# Patient Record
Sex: Female | Born: 1955 | Race: White | Hispanic: No | Marital: Single | Smoking: Never smoker
Health system: Southern US, Community
[De-identification: ages and names within clinical notes are randomized; demographics above are authoritative.]

## PROBLEM LIST (undated history)

## (undated) DIAGNOSIS — E039 Hypothyroidism, unspecified: Secondary | ICD-10-CM

## (undated) DIAGNOSIS — M25472 Effusion, left ankle: Secondary | ICD-10-CM

## (undated) DIAGNOSIS — K2100 Gastro-esophageal reflux disease with esophagitis, without bleeding: Secondary | ICD-10-CM

## (undated) DIAGNOSIS — E079 Disorder of thyroid, unspecified: Secondary | ICD-10-CM

## (undated) DIAGNOSIS — K219 Gastro-esophageal reflux disease without esophagitis: Secondary | ICD-10-CM

## (undated) DIAGNOSIS — M810 Age-related osteoporosis without current pathological fracture: Secondary | ICD-10-CM

## (undated) DIAGNOSIS — D219 Benign neoplasm of connective and other soft tissue, unspecified: Secondary | ICD-10-CM

## (undated) DIAGNOSIS — Z780 Asymptomatic menopausal state: Secondary | ICD-10-CM

## (undated) DIAGNOSIS — E7800 Pure hypercholesterolemia, unspecified: Secondary | ICD-10-CM

## (undated) DIAGNOSIS — F32A Depression, unspecified: Secondary | ICD-10-CM

## (undated) DIAGNOSIS — F322 Major depressive disorder, single episode, severe without psychotic features: Secondary | ICD-10-CM

## (undated) DIAGNOSIS — I10 Essential (primary) hypertension: Secondary | ICD-10-CM

## (undated) DIAGNOSIS — K635 Polyp of colon: Secondary | ICD-10-CM

## (undated) HISTORY — PX: TONSILLECTOMY: SUR1361

## (undated) HISTORY — PX: COLONOSCOPY: SHX174

---

## 2017-12-12 ENCOUNTER — Ambulatory Visit
Admission: RE | Admit: 2017-12-12 | Discharge: 2017-12-12 | Disposition: A | Discharge status: Home / Self Care | Payer: Self-pay | Source: Ambulatory Visit | Attending: Oncology | Admitting: Oncology

## 2017-12-12 ENCOUNTER — Ambulatory Visit: Payer: Self-pay | Attending: Oncology

## 2017-12-12 ENCOUNTER — Other Ambulatory Visit: Payer: Self-pay

## 2017-12-12 VITALS — BP 102/70 | HR 61 | Temp 98.0°F | Resp 12 | Ht 65.0 in | Wt 167.0 lb

## 2017-12-12 DIAGNOSIS — Z0000 Encounter for general adult medical examination without abnormal findings: Secondary | ICD-10-CM | POA: Insufficient documentation

## 2017-12-12 NOTE — Progress Notes (Signed)
  Subjective:     Patient ID: Berta Denson, female   DOB: 1955/12/09, 62 y.o.   MRN: [id number]  HPI   Review of Systems     Objective:   Physical Exam  Pulmonary/Chest: Right breast exhibits no inverted nipple, no mass, no nipple discharge, no skin change and no tenderness. Left breast exhibits no inverted nipple, no mass, no nipple discharge, no skin change and no tenderness. Breasts are asymmetrical.  Right breast larger than left  Genitourinary: No labial fusion. There is no rash, tenderness, lesion or injury on the right labia. There is no rash, tenderness, lesion or injury on the left labia. Uterus is not deviated, not enlarged, not fixed and not tender. Cervix exhibits no motion tenderness, no discharge and no friability. Right adnexum displays no mass, no tenderness and no fullness. Left adnexum displays no mass, no tenderness and no fullness. No erythema, tenderness or bleeding in the vagina. No foreign body in the vagina. No signs of injury around the vagina. No vaginal discharge found.       Assessment:     62 year old patient presents for [facility] clinic visit.  Patient screened, and meets [facility] eligibility.  Patient does not have insurance, Medicare or Medicaid.  Handout given on Affordable Care Act.  Instructed patient on breast self awareness using teach back method.  Clinical breast exam unremarkable.  No mass or lump palpated.  Patient had a pap in April 2018 with LSIL/HPV positive results.  Patient states she had follow-up, and normal biopsy results.  Unable to find.  Pap performed.     Plan:     Sent for bilateral screening mammogram.  Specimen collected for pap.

## 2017-12-12 NOTE — Progress Notes (Signed)
Sent for bilateral screening mammogram. Specimen collected for pap.

## 2017-12-14 ENCOUNTER — Other Ambulatory Visit: Payer: Self-pay | Admitting: *Deleted

## 2017-12-14 DIAGNOSIS — N6489 Other specified disorders of breast: Secondary | ICD-10-CM

## 2017-12-19 LAB — PAP LB AND HPV HIGH-RISK
HPV, high-risk: POSITIVE — AB
PAP SMEAR COMMENT: 0

## 2017-12-21 NOTE — Progress Notes (Signed)
Phoned patient with ASCUS/HPV positive pap results.  Will schedule with GYN clinic, and give appointment to patient next week.

## 2017-12-24 ENCOUNTER — Ambulatory Visit
Admission: RE | Admit: 2017-12-24 | Discharge: 2017-12-24 | Disposition: A | Discharge status: Home / Self Care | Payer: Self-pay | Source: Ambulatory Visit | Attending: Oncology | Admitting: Oncology

## 2017-12-24 DIAGNOSIS — N6489 Other specified disorders of breast: Secondary | ICD-10-CM | POA: Insufficient documentation

## 2017-12-25 NOTE — Progress Notes (Signed)
Phoned patient with Birads 2 mammogram results.  Scheduled her with Dr. Logan Bores at [facility] on 01/09/18 at 2:00.  Left appointment info message, and mailed appointment reminder.

## 2017-12-25 NOTE — Patient Instructions (Signed)
Colposcopy  Colposcopy is a procedure to examine the lowest part of the uterus (cervix), the vagina, and the area around the vaginal opening (vulva) for abnormalities or signs of disease. The procedure is done using a lighted microscope or magnifying lens (colposcope). If any unusual cells are found during the procedure, your health care provider may remove a tissue sample for testing (biopsy). A colposcopy may be done if you:   Have an abnormal Pap test. A Pap test is a screening test that is used to check for signs of cancer or infection of the vagina, cervix, and uterus.   Have a Pap smear test in which you test positive for high-risk HPV (human papillomavirus).   Have a sore or lesion on your cervix.   Have genital warts on your vulva, vagina, or cervix.   Took certain medicines while pregnant, such as diethylstilbestrol (DES).   Have pain during sexual intercourse.   Have vaginal bleeding, especially after sexual intercourse.   Need to have a cervical polyp removed.   Need to have a lost intrauterine device (IUD) string located.    Let your health care provider know about:   Any allergies you have, including allergies to prescribed medicine, latex, or iodine.   All medicines you are taking, including vitamins, herbs, eye drops, creams, and over-the-counter medicines. Bring a list of all of your medicines to your appointment.   Any problems you or family members have had with anesthetic medicines.   Any blood disorders you have.   Any surgeries you have had.   Any medical conditions you have, such as pelvic inflammatory disease (PID) or endometrial disorder.   Any history of frequent fainting.   Your menstrual cycle and what form of birth control (contraception) you use.   Your medical history, including any prior cervical treatment.   Whether you are pregnant or may be pregnant.  What are the risks?  Generally, this is a safe procedure. However, problems may occur,  including:   Pain.   Infection, which may include a fever, bad-smelling discharge, or pelvic pain.   Bleeding or discharge.   Misdiagnosis.   Fainting and vasovagal reactions, but this is rare.   Allergic reactions to medicines.   Damage to other structures or organs.    What happens before the procedure?   If you have your menstrual period or will have it at the time of your procedure, tell your health care provider. A colposcopy typically is not done during menstruation.   Continue your contraceptive practices before and after the procedure.   For 24 hours before the colposcopy:  ? Do not douche.  ? Do not use tampons.  ? Do not use medicines, creams, or suppositories in the vagina.  ? Do not have sexual intercourse.   Ask your health care provider about:  ? Changing or stopping your regular medicines. This is especially important if you are taking diabetes medicines or blood thinners.  ? Taking medicines such as aspirin and ibuprofen. These medicines can thin your blood. Do not take these medicines before your procedure if your health care provider instructs you not to. It is likely that your health care provider will tell you to avoid taking aspirin or medicine that contains aspirin for 7 days before the procedure.   Follow instructions from your health care provider about eating or drinking restrictions. You will likely need to eat a regular diet the day of the procedure and not skip any meals.   You   may have an exam or testing. A pregnancy test will be taken on the day of the procedure.   You may have a blood or urine sample taken.   Plan to have someone take you home from the hospital or clinic.   If you will be going home right after the procedure, plan to have someone with you for 24 hours.  What happens during the procedure?   You will lie down on your back, with your feet in foot rests (stirrups).   A warmed and lubricated instrument (speculum) will be inserted into your vagina. The  speculum will be used to hold apart the walls of your vagina so your health care provider can see your cervix and the inside of your vagina.   A cotton swab will be used to place a small amount of liquid solution on the areas to be examined. This solution makes it easier to see abnormal cells. You may feel a slight burning during this part.   The colposcope will be used to scan the cervix with a bright white light. The colposcope will be held near your vulvaand will magnify your vulva, vagina, and cervix for easier examination.   Your health care provider may decide to take a biopsy. If so:  ? You may be given medicine to numb the area (local anesthetic).  ? Surgical instruments will be used to suck out mucus and cells through your vagina.  ? You may feel mild pain while the tissue sample is removed.  ? Bleeding may occur. A solution may be used to stop the bleeding.  ? If a sample of tissue is needed from the inside of the cervix, a different procedure called endocervical curettage (ECC) may be completed. During this procedure, a curved instrument (curette) will be used to scrape cells from your cervix or the top of your cervix (endocervix).   Your health care provider will record the location of any abnormalities.  The procedure may vary among health care providers and hospitals.  What happens after the procedure?   You will lie down and rest for a few minutes. You may be offered juice or cookies.   Your blood pressure, heart rate, breathing rate, and blood oxygen level will be monitored until any medicines you were given have worn off.   You may have to wear compression stockings. These stockings help to prevent blood clots and reduce swelling in your legs.   You may have some cramping in your abdomen. This should go away after a few minutes.  This information is not intended to replace advice given to you by your health care provider. Make sure you discuss any questions you have with your health care  provider.  Document Released: 10/21/2002 Document Revised: 03/28/2016 Document Reviewed: 03/06/2016  Elsevier Interactive Patient Education  2018 [employer]

## 2018-01-02 NOTE — Progress Notes (Signed)
Patient phoned leaving message she received appointment info for Dr. Logan Bores at [facility].  She would like to change appointment due to starting a new job.    Left phone number for [facility] on her voicemail, and requested she notify [facility] of new appointment date/time.

## 2018-01-09 ENCOUNTER — Encounter: Payer: Self-pay | Admitting: Obstetrics and Gynecology

## 2018-07-15 DIAGNOSIS — E782 Mixed hyperlipidemia: Secondary | ICD-10-CM | POA: Diagnosis not present

## 2018-07-15 DIAGNOSIS — I10 Essential (primary) hypertension: Secondary | ICD-10-CM | POA: Diagnosis not present

## 2018-07-15 DIAGNOSIS — R7309 Other abnormal glucose: Secondary | ICD-10-CM | POA: Diagnosis not present

## 2018-07-29 DIAGNOSIS — Z23 Encounter for immunization: Secondary | ICD-10-CM | POA: Diagnosis not present

## 2018-07-29 DIAGNOSIS — R21 Rash and other nonspecific skin eruption: Secondary | ICD-10-CM | POA: Diagnosis not present

## 2018-07-29 DIAGNOSIS — I10 Essential (primary) hypertension: Secondary | ICD-10-CM | POA: Diagnosis not present

## 2018-09-23 DIAGNOSIS — M79671 Pain in right foot: Secondary | ICD-10-CM | POA: Diagnosis not present

## 2018-09-23 DIAGNOSIS — E039 Hypothyroidism, unspecified: Secondary | ICD-10-CM | POA: Diagnosis not present

## 2018-09-23 DIAGNOSIS — R5383 Other fatigue: Secondary | ICD-10-CM | POA: Diagnosis not present

## 2018-09-23 DIAGNOSIS — M19071 Primary osteoarthritis, right ankle and foot: Secondary | ICD-10-CM | POA: Diagnosis not present

## 2018-10-16 NOTE — Progress Notes (Signed)
Patient cancelled appointment with Dr. Logan Bores, and did not reschedule.  Copy to [facility].

## 2018-11-12 ENCOUNTER — Encounter: Payer: Self-pay | Admitting: Obstetrics and Gynecology

## 2019-01-07 ENCOUNTER — Encounter: Payer: Self-pay | Admitting: Obstetrics and Gynecology

## 2019-12-15 ENCOUNTER — Telehealth (HOSPITAL_COMMUNITY): Payer: 59 | Admitting: Professional

## 2019-12-15 ENCOUNTER — Other Ambulatory Visit: Payer: Self-pay

## 2019-12-17 ENCOUNTER — Telehealth (HOSPITAL_COMMUNITY): Payer: Self-pay | Admitting: Professional

## 2020-01-06 ENCOUNTER — Ambulatory Visit (HOSPITAL_COMMUNITY): Payer: 59 | Admitting: Psychiatry

## 2020-01-06 ENCOUNTER — Other Ambulatory Visit: Payer: Self-pay

## 2020-01-17 ENCOUNTER — Encounter (HOSPITAL_COMMUNITY): Payer: Self-pay

## 2020-01-17 ENCOUNTER — Other Ambulatory Visit: Payer: Self-pay

## 2020-01-17 ENCOUNTER — Telehealth (HOSPITAL_COMMUNITY): Payer: 59 | Admitting: Psychiatry

## 2021-04-08 ENCOUNTER — Other Ambulatory Visit: Payer: Self-pay | Admitting: Family Medicine

## 2021-04-08 DIAGNOSIS — Z1231 Encounter for screening mammogram for malignant neoplasm of breast: Secondary | ICD-10-CM

## 2021-04-22 ENCOUNTER — Ambulatory Visit
Admission: RE | Admit: 2021-04-22 | Discharge: 2021-04-22 | Disposition: A | Discharge status: Home / Self Care | Payer: Medicare Other | Source: Ambulatory Visit | Attending: Family Medicine | Admitting: Family Medicine

## 2021-04-22 ENCOUNTER — Other Ambulatory Visit: Payer: Self-pay

## 2021-04-22 DIAGNOSIS — Z1231 Encounter for screening mammogram for malignant neoplasm of breast: Secondary | ICD-10-CM | POA: Diagnosis not present

## 2021-04-25 ENCOUNTER — Other Ambulatory Visit: Payer: Self-pay | Admitting: *Deleted

## 2021-04-25 ENCOUNTER — Inpatient Hospital Stay
Admission: RE | Admit: 2021-04-25 | Discharge: 2021-04-25 | Disposition: A | Discharge status: Home / Self Care | Payer: Self-pay | Source: Ambulatory Visit | Attending: *Deleted | Admitting: *Deleted

## 2021-04-25 DIAGNOSIS — Z1231 Encounter for screening mammogram for malignant neoplasm of breast: Secondary | ICD-10-CM

## 2021-10-26 ENCOUNTER — Other Ambulatory Visit: Payer: Self-pay | Admitting: Gastroenterology

## 2021-11-07 ENCOUNTER — Other Ambulatory Visit: Payer: Self-pay

## 2021-11-07 ENCOUNTER — Ambulatory Visit
Admission: RE | Admit: 2021-11-07 | Discharge: 2021-11-07 | Disposition: A | Discharge status: Home / Self Care | Payer: Medicare Other | Source: Ambulatory Visit | Attending: Gastroenterology | Admitting: Gastroenterology

## 2022-03-07 IMAGING — MG MM DIGITAL SCREENING BILAT W/ TOMO AND CAD
8 series · 9 of 24 positions shown · non-contrast
Comparison: Previous exam(s).

CLINICAL DATA: Screening.

EXAM:
DIGITAL SCREENING BILATERAL MAMMOGRAM WITH TOMOSYNTHESIS AND CAD
TECHNIQUE: Bilateral screening digital craniocaudal and mediolateral oblique
mammograms were obtained. Bilateral screening digital breast
tomosynthesis was performed. The images were evaluated with
computer-aided detection.

[R MLO synth-2D]
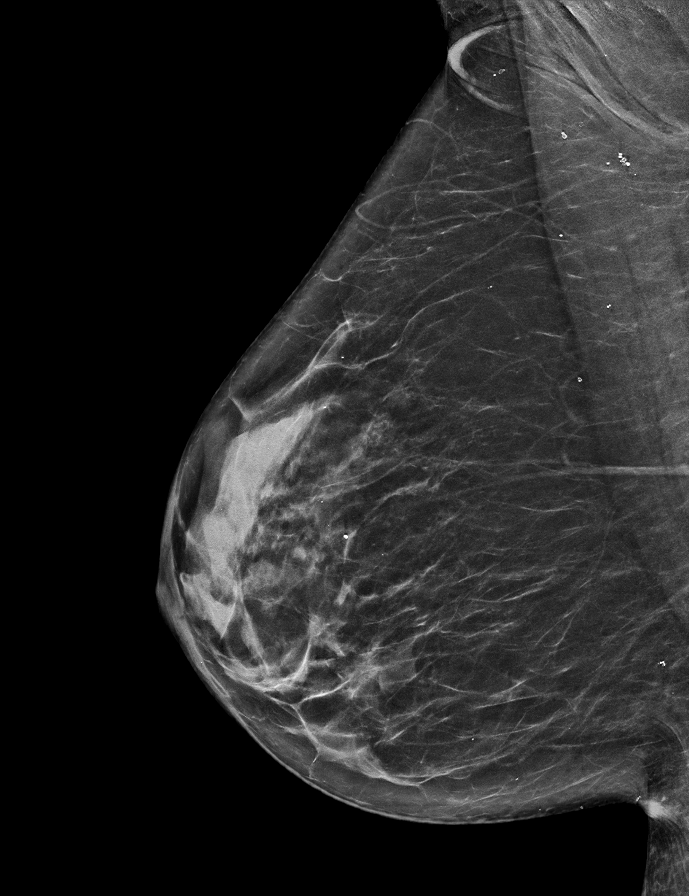

[L CC synth-2D]
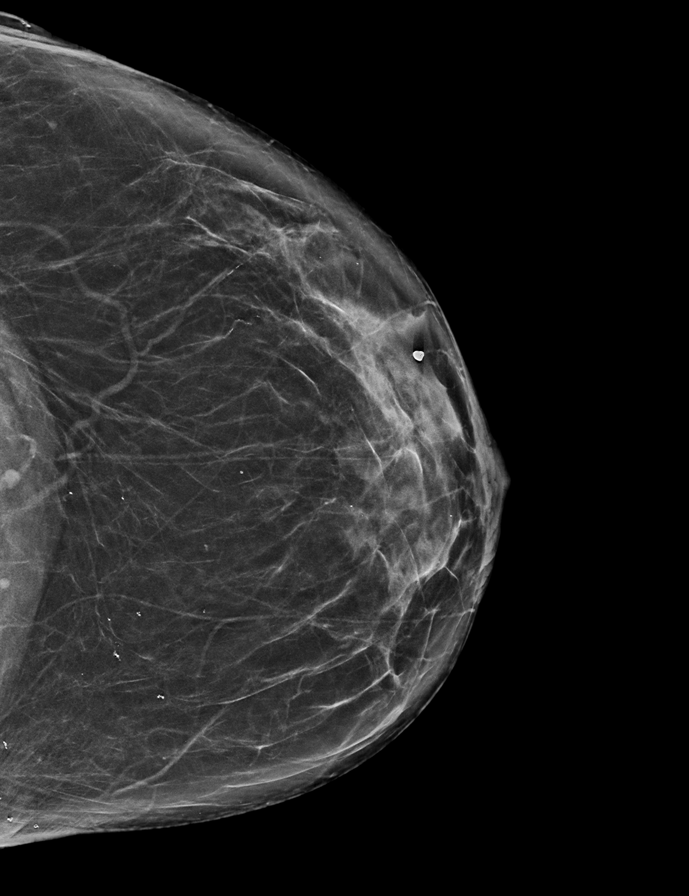

[L MLO synth-2D]
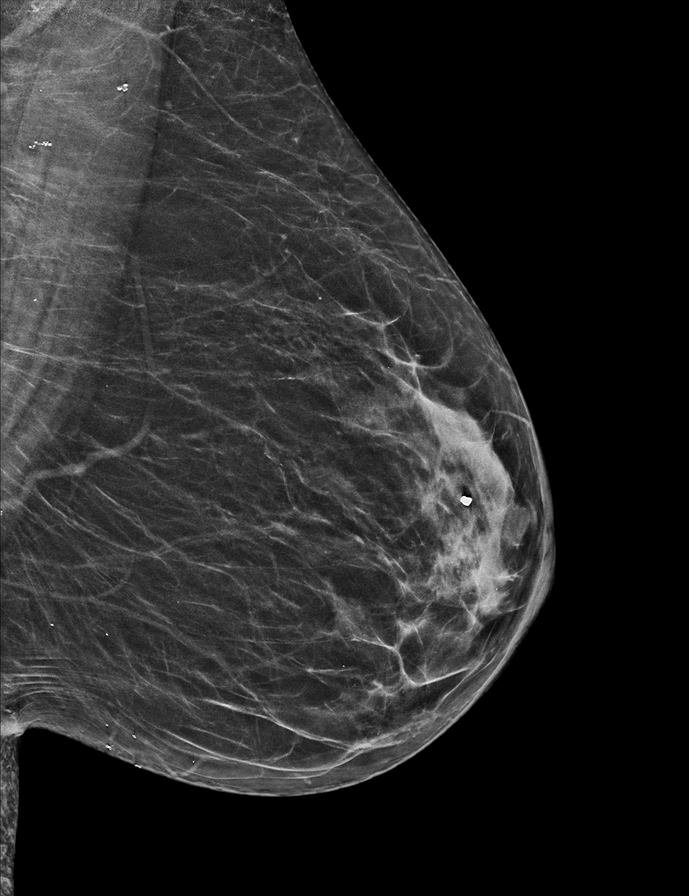

[R CC synth-2D]
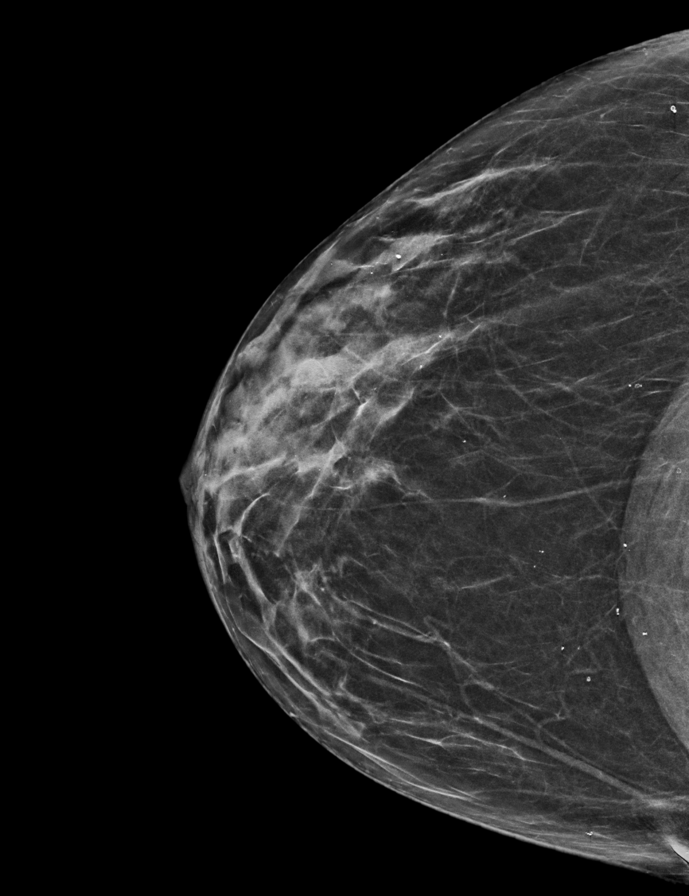

[R MLO tomo · 2 of 66 frames shown]
[frame 22/66]
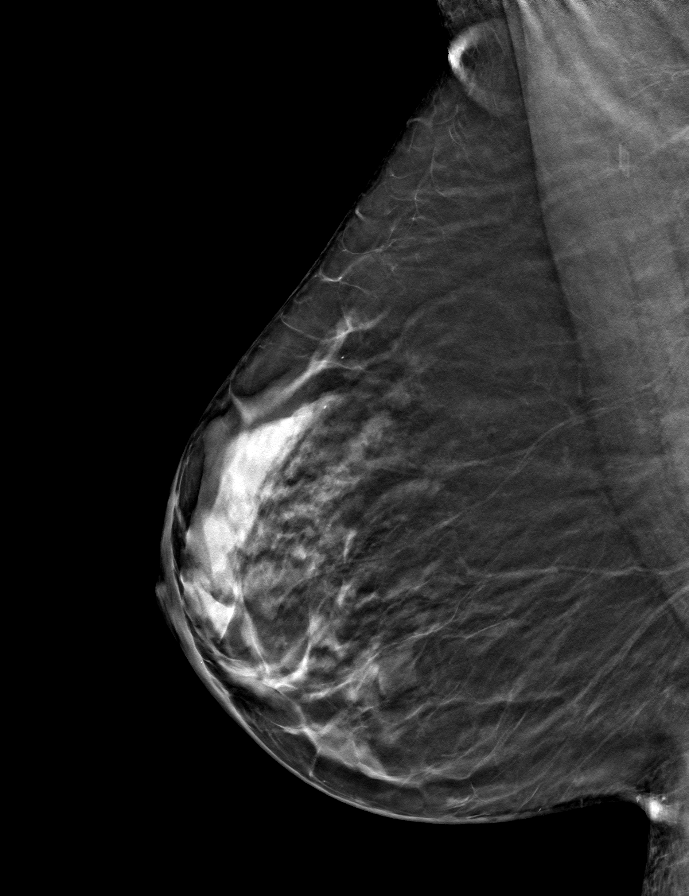
[frame 33/66]
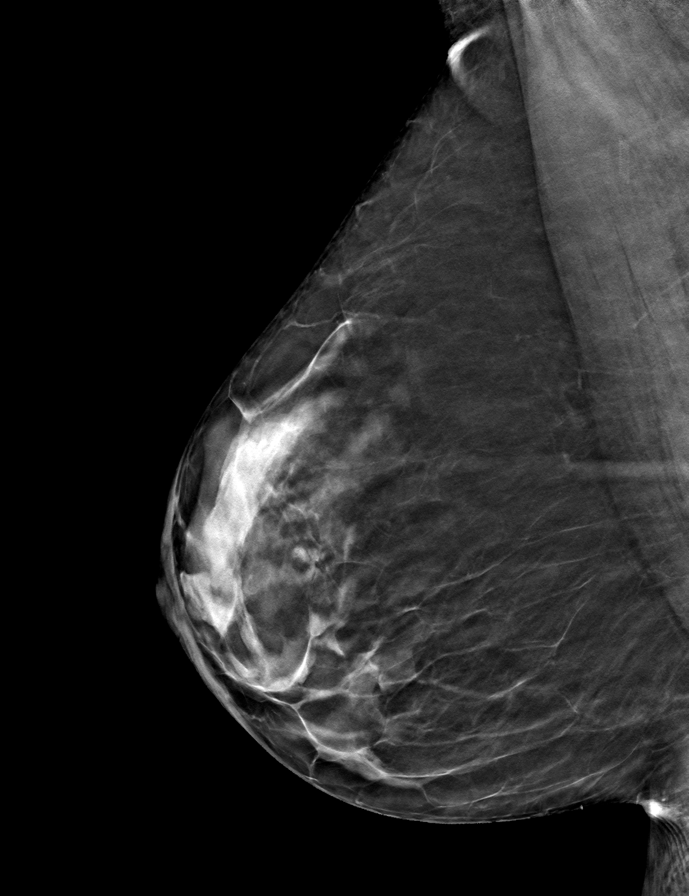

[L MLO tomo · tomo slice 27/52.0]
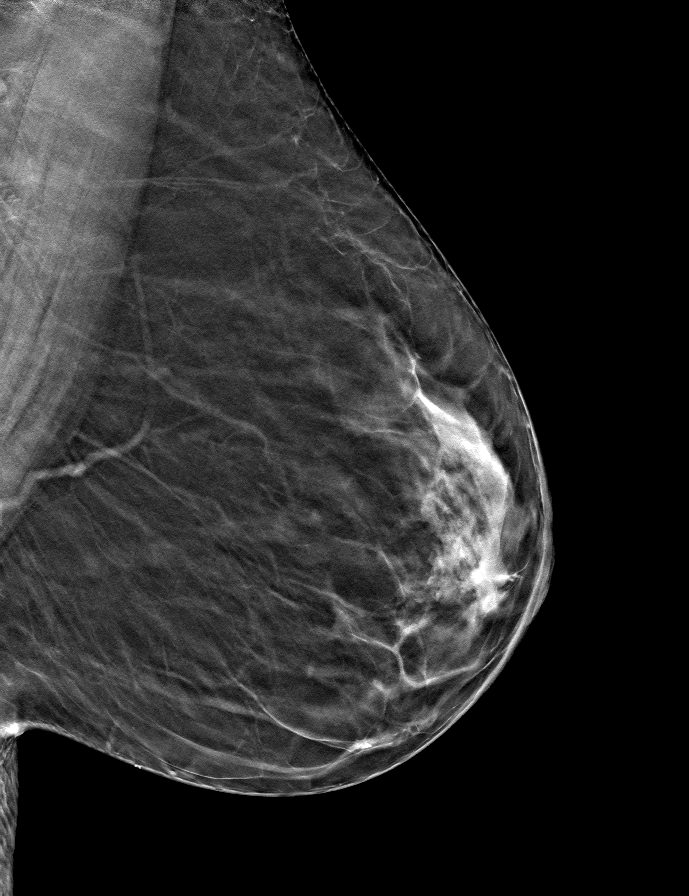

[L CC tomo · tomo slice 33/66.0]
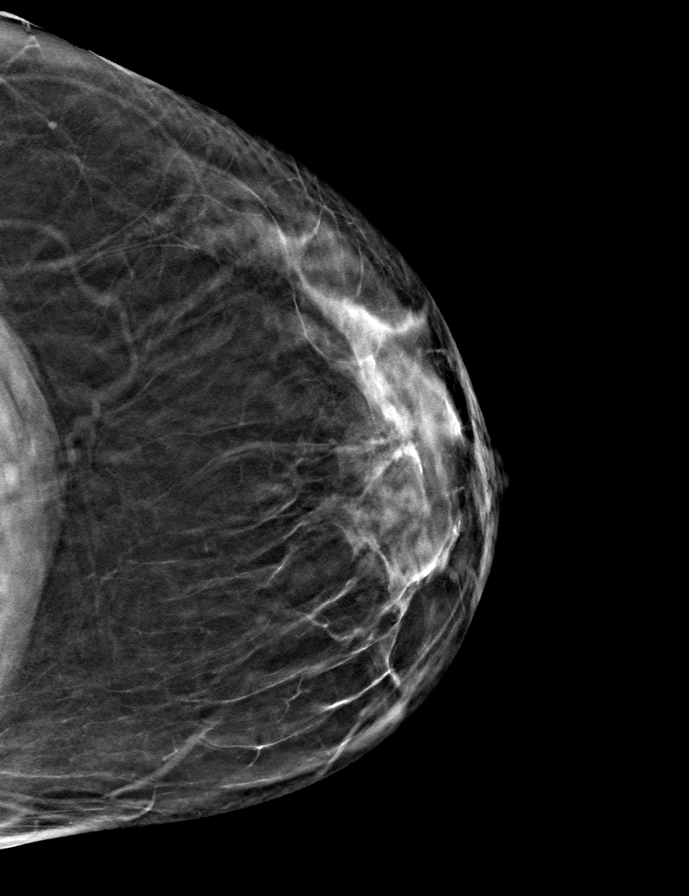

[R CC tomo · tomo slice 25/49.0]
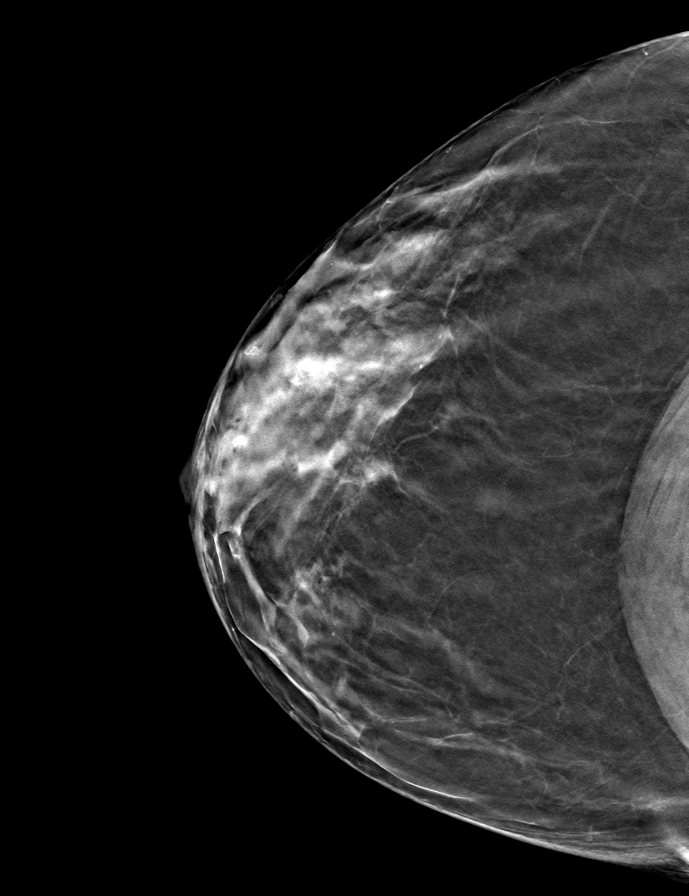

[9 of 24 positions shown; findings below may reference images not displayed]

ACR Breast Density Category c: The breast tissue is heterogeneously
dense, which may obscure small masses.
FINDINGS: There are no findings suspicious for malignancy.
IMPRESSION: No mammographic evidence of malignancy. A result letter of this
screening mammogram will be mailed directly to the patient.

RECOMMENDATION:
Screening mammogram in one year. (Code:Q3-W-BC3)

BI-RADS CATEGORY  1: Negative.

## 2022-03-17 ENCOUNTER — Ambulatory Visit: Admission: RE | Admit: 2022-03-17 | Payer: Medicare Other | Source: Home / Self Care | Admitting: Gastroenterology

## 2022-03-17 ENCOUNTER — Encounter: Admission: RE | Payer: Self-pay | Source: Home / Self Care

## 2022-03-17 SURGERY — COLONOSCOPY
Anesthesia: General

## 2022-09-14 ENCOUNTER — Other Ambulatory Visit: Payer: Self-pay | Admitting: Internal Medicine

## 2022-09-14 DIAGNOSIS — R0602 Shortness of breath: Secondary | ICD-10-CM

## 2022-09-14 DIAGNOSIS — I351 Nonrheumatic aortic (valve) insufficiency: Secondary | ICD-10-CM

## 2022-09-21 ENCOUNTER — Telehealth (HOSPITAL_COMMUNITY): Payer: Self-pay | Admitting: Emergency Medicine

## 2022-09-21 NOTE — Telephone Encounter (Signed)
Attempted to call patient regarding upcoming cardiac CT appointment. °Left message on voicemail with name and callback number °Doriann Zuch RN Navigator Cardiac Imaging °[facility] °[phone] Office °[phone] Cell ° °

## 2022-09-22 ENCOUNTER — Encounter (HOSPITAL_COMMUNITY): Payer: Self-pay

## 2022-09-22 ENCOUNTER — Telehealth (HOSPITAL_COMMUNITY): Payer: Self-pay | Admitting: Emergency Medicine

## 2022-09-22 DIAGNOSIS — R079 Chest pain, unspecified: Secondary | ICD-10-CM

## 2022-09-22 IMAGING — US US ABDOMEN COMPLETE W/ ELASTOGRAPHY
1 series · 12 of 25 positions shown · non-contrast
Comparison: None.

CLINICAL DATA: Hemochromatosis, hereditary (HCC)

EXAM:
ULTRASOUND ABDOMEN
ULTRASOUND HEPATIC ELASTOGRAPHY
TECHNIQUE: Sonography of the upper abdomen was performed. In addition,
ultrasound elastography evaluation of the liver was performed. A
region of interest was placed within the right lobe of the liver.
Following application of a compressive sonographic pulse, tissue
compressibility was assessed. Multiple assessments were performed at
the selected site. Median tissue compressibility was determined.
Previously, hepatic stiffness was assessed by shear wave velocity.
Based on recently published Society of Radiologists in Ultrasound
consensus article, reporting is now recommended to be performed in
the SI units of pressure (kiloPascals) representing hepatic
stiffness/elasticity. The obtained result is compared to the
published reference standards. (cACLD = compensated Advanced Chronic
Liver Disease)

[Series 1: us abdomen complete w/elastography · 12 of 94 slices shown]
[im 4/94]
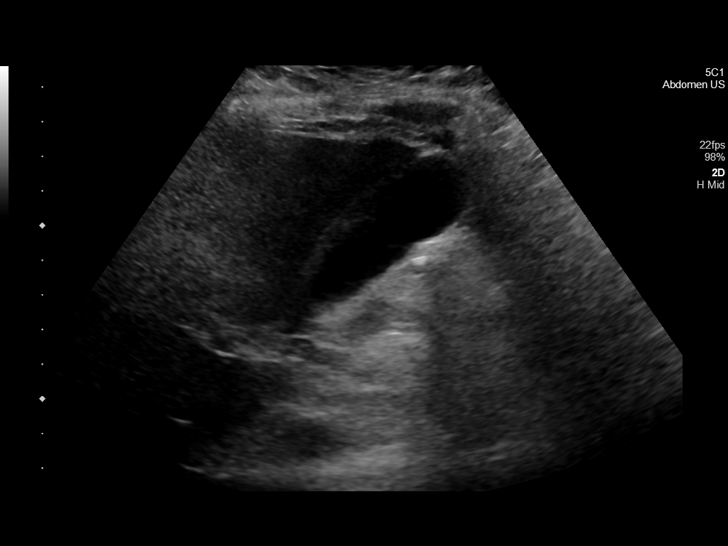
[im 12/94]
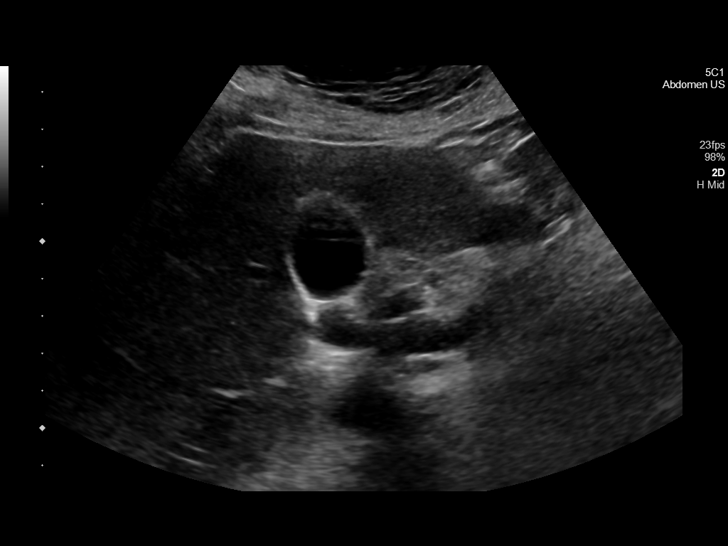
[im 20/94]
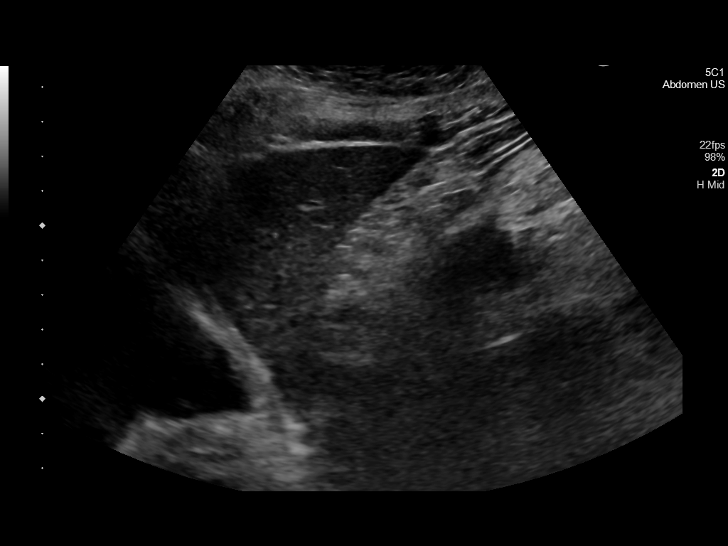
[im 28/94]
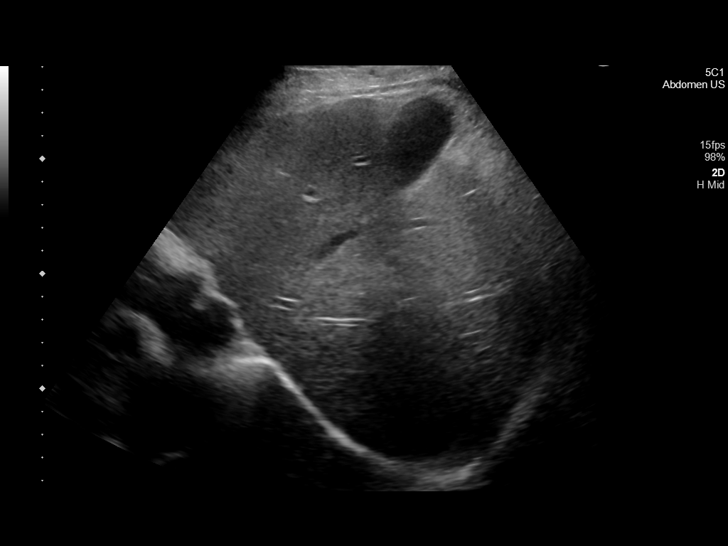
[im 35/94]
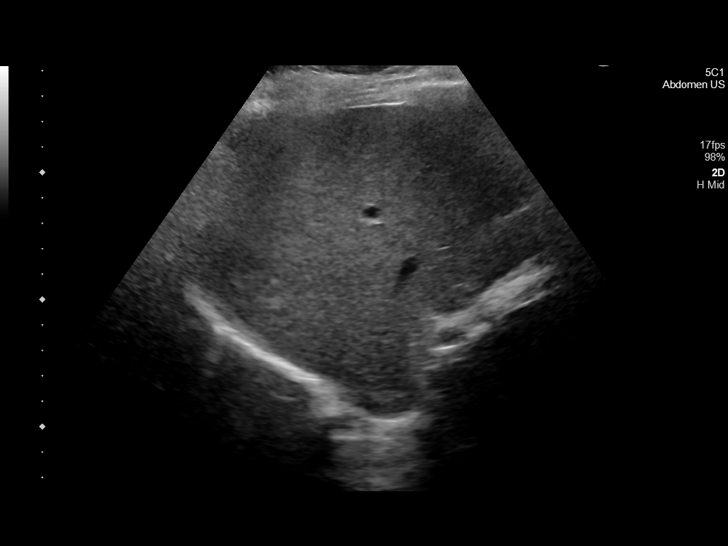
[im 43/94]
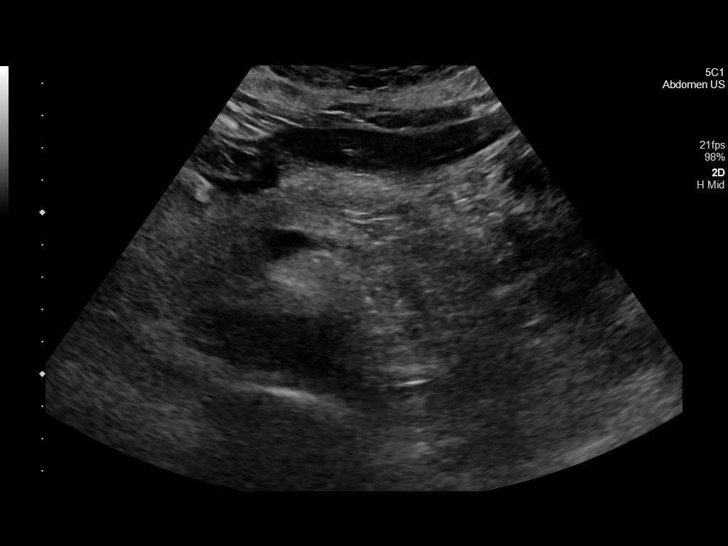
[im 51/94]
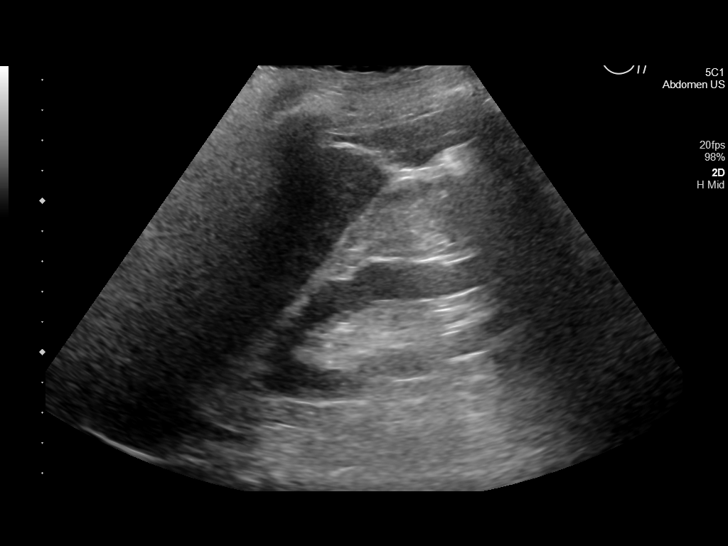
[im 59/94]
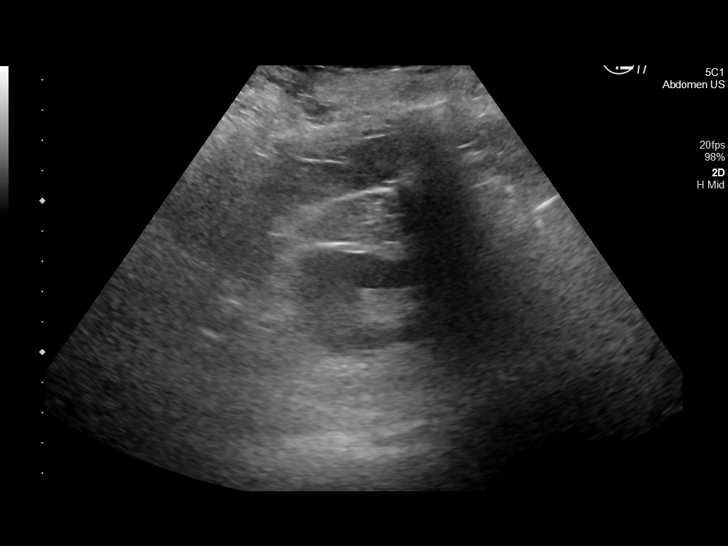
[im 66/94]
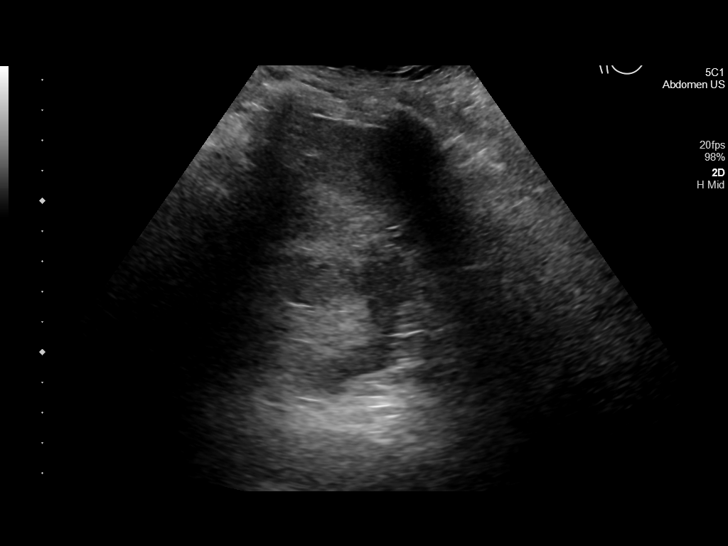
[im 74/94]
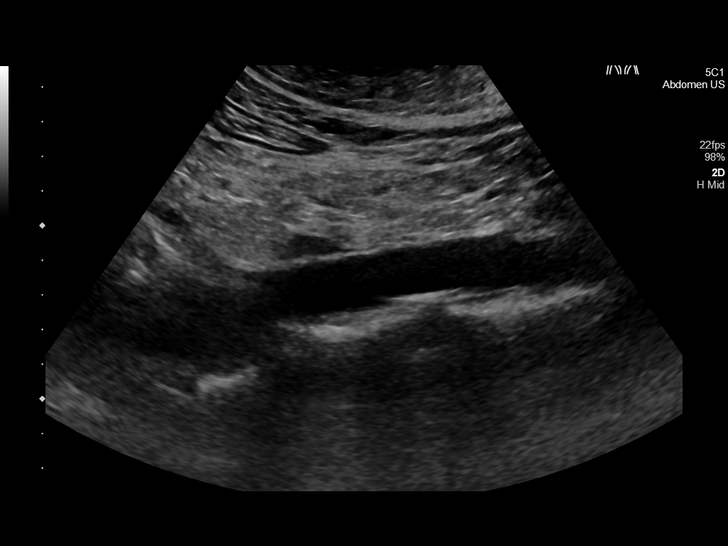
[im 82/94]
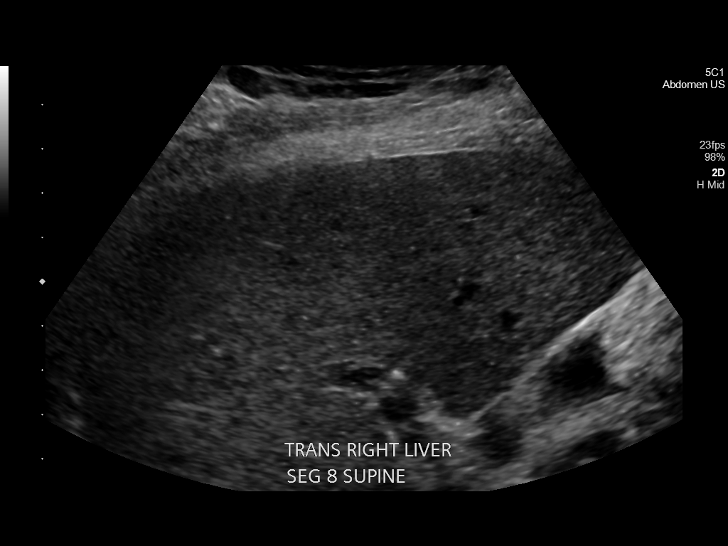
[im 90/94]
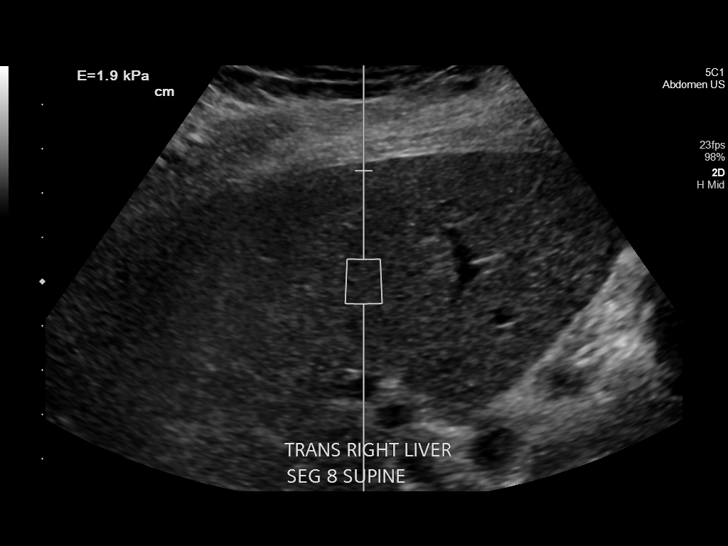

[12 of 25 positions shown; findings below may reference images not displayed]

FINDINGS: ULTRASOUND ABDOMEN

Gallbladder: No gallstones or wall thickening visualized. No
sonographic Murphy sign noted by sonographer.

Common bile duct: Diameter: Normal caliber, 5 mm

Liver: Mildly heterogeneous echotexture. No focal abnormality or
biliary ductal dilatation. Portal vein is patent on color Doppler
imaging with normal direction of blood flow towards the liver.

IVC: No abnormality visualized.

Pancreas: Visualized portion unremarkable.

Spleen: Size and appearance within normal limits.

Right Kidney: Length: 9.1 cm. Echogenicity within normal limits. No
mass or hydronephrosis visualized.

Left Kidney: Length: 9.2 cm. Echogenicity within normal limits. No
mass or hydronephrosis visualized.

Abdominal aorta: No aneurysm visualized.

Other findings: None.

ULTRASOUND HEPATIC ELASTOGRAPHY

Device: Siemens Helix VTQ

Patient position: Supine

Transducer 5C1

Number of measurements: 10

Hepatic segment:  8

Median kPa:

IQR:

IQR/Median kPa ratio:

Data quality:  Good

Diagnostic category:  < or = 5 kPa: high probability of being normal

The use of hepatic elastography is applicable to patients with viral
hepatitis and non-alcoholic fatty liver disease. At this time, there
is insufficient data for the referenced cut-off values and use in
other causes of liver disease, including alcoholic liver disease.
Patients, however, may be assessed by elastography and serve as
their own reference standard/baseline.

In patients with non-alcoholic liver disease, the values suggesting
compensated advanced chronic liver disease (cACLD) may be lower, and
patients may need additional testing with elasticity results of [DATE]
kPa.

Please note that abnormal hepatic elasticity and shear wave
velocities may also be identified in clinical settings other than
with hepatic fibrosis, such as: acute hepatitis, elevated right
heart and central venous pressures including use of beta blockers,
Chio Pang disease ([place]), infiltrative processes such as
mastocytosis/amyloidosis/infiltrative tumor/lymphoma, extrahepatic
cholestasis, with hyperemia in the post-prandial state, and with
liver transplantation. Correlation with patient history, laboratory
data, and clinical condition recommended.

Diagnostic Categories:

< or =5 kPa: high probability of being normal

< or =9 kPa: in the absence of other known clinical signs, rules [DATE] kPa and ?13 kPa: suggestive of cACLD, but needs further testing

>13 kPa: highly suggestive of cACLD

> or =17 kPa: highly suggestive of cACLD with an increased
probability of clinically significant portal hypertension
IMPRESSION: ULTRASOUND ABDOMEN:

No acute findings

ULTRASOUND HEPATIC ELASTOGRAPHY:

Median kPa:

Diagnostic category:  < or = 5 kPa: high probability of being normal

## 2022-09-22 MED ORDER — METOPROLOL TARTRATE 100 MG PO TABS: 100.0000 mg | ORAL_TABLET | Freq: Once | ORAL | 0 refills | Status: AC

## 2022-09-22 NOTE — Telephone Encounter (Signed)
Attempted to call patient regarding upcoming cardiac CT appointment. °Left message on voicemail with name and callback number °Andranik Jeune RN Navigator Cardiac Imaging °[facility] °[phone] Office °[phone] Cell ° °

## 2022-09-22 NOTE — Telephone Encounter (Signed)
Reaching out to patient to offer assistance regarding upcoming cardiac imaging study; pt verbalizes understanding of appt date/time, parking situation and where to check in, pre-test NPO status and medications ordered, and verified current allergies; name and call back number provided for further questions should they arise Marchia Bond RN Navigator Cardiac Imaging [facility] [phone] office [phone] cell  Arrival 230 [facility] Hold HCTZ 19m metoprolol tart Difficult iv  Aware contrast/nitro

## 2022-09-25 ENCOUNTER — Ambulatory Visit
Admission: RE | Admit: 2022-09-25 | Discharge: 2022-09-25 | Disposition: A | Discharge status: Home / Self Care | Payer: Medicare Other | Source: Ambulatory Visit | Attending: Internal Medicine | Admitting: Internal Medicine

## 2022-09-25 DIAGNOSIS — R0602 Shortness of breath: Secondary | ICD-10-CM | POA: Diagnosis present

## 2022-09-25 DIAGNOSIS — I351 Nonrheumatic aortic (valve) insufficiency: Secondary | ICD-10-CM | POA: Diagnosis present

## 2022-09-25 HISTORY — DX: Essential (primary) hypertension: I10

## 2022-09-25 MED ORDER — NITROGLYCERIN 0.4 MG SL SUBL
0.8000 mg | SUBLINGUAL_TABLET | Freq: Once | SUBLINGUAL | Status: AC
  Administered 2022-09-25: 0.8 mg via SUBLINGUAL

## 2022-09-25 MED ORDER — IOHEXOL 350 MG/ML SOLN
75.0000 mL | Freq: Once | INTRAVENOUS | Status: AC | PRN
  Administered 2022-09-25: 75 mL via INTRAVENOUS

## 2022-09-25 NOTE — Progress Notes (Signed)
Patient tolerated procedure well. Ambulate w/o difficulty. Denies any lightheadedness or being dizzy. Pt denies any pain at this time. Sitting in chair, pt is encouraged to drink additional water throughout the day and reason explained to patient. Patient verbalized understanding and all questions answered. ABC intact. No further needs at this time. Discharge from procedure area w/o issues.  

## 2023-01-17 ENCOUNTER — Other Ambulatory Visit: Payer: Self-pay | Admitting: Family Medicine

## 2023-01-17 DIAGNOSIS — Z1231 Encounter for screening mammogram for malignant neoplasm of breast: Secondary | ICD-10-CM

## 2023-01-25 ENCOUNTER — Ambulatory Visit
Admission: RE | Admit: 2023-01-25 | Discharge: 2023-01-25 | Disposition: A | Discharge status: Home / Self Care | Payer: Medicare Other | Source: Ambulatory Visit | Attending: Family Medicine | Admitting: Family Medicine

## 2023-01-25 DIAGNOSIS — Z1231 Encounter for screening mammogram for malignant neoplasm of breast: Secondary | ICD-10-CM | POA: Insufficient documentation

## 2023-01-28 ENCOUNTER — Ambulatory Visit
Admission: EM | Admit: 2023-01-28 | Discharge: 2023-01-28 | Disposition: A | Discharge status: Home / Self Care | Payer: Medicare Other

## 2023-01-28 DIAGNOSIS — Z23 Encounter for immunization: Secondary | ICD-10-CM

## 2023-01-28 DIAGNOSIS — T24211A Burn of second degree of right thigh, initial encounter: Secondary | ICD-10-CM

## 2023-01-28 HISTORY — DX: Pure hypercholesterolemia, unspecified: E78.00

## 2023-01-28 HISTORY — DX: Disorder of thyroid, unspecified: E07.9

## 2023-01-28 MED ORDER — TETANUS-DIPHTH-ACELL PERTUSSIS 5-2.5-18.5 LF-MCG/0.5 IM SUSY
0.5000 mL | PREFILLED_SYRINGE | Freq: Once | INTRAMUSCULAR | Status: AC
Start: 2023-01-28 — End: 2023-01-28
  Administered 2023-01-28: 0.5 mL via INTRAMUSCULAR

## 2023-01-28 MED ORDER — SILVER SULFADIAZINE 1 % EX CREA: 1.0000 | TOPICAL_CREAM | Freq: Every day | CUTANEOUS | 1 refills | Status: AC

## 2023-01-28 MED ORDER — SILVER SULFADIAZINE 1 % EX CREA: TOPICAL_CREAM | Freq: Once | CUTANEOUS | Status: AC

## 2023-01-28 NOTE — ED Provider Notes (Signed)
Renaldo Fiddler    CSN: [id number] Arrival date & time: 01/28/23  0801      History   Chief Complaint Chief Complaint  Patient presents with   Burn    HPI Robin Marsh is a 67 y.o. female.  Patient presents with a burn on her right thigh after she accidentally spilled hot coffee on 01/24/2023.  She has been treating it with Neosporin.  The blister on the burn started leaking yesterday.  The redness of the burn has Not expanded and no purulent drainage.  No fever, chills, or other symptoms.  Her medical history includes hypertension, GERD, hypothyroidism, osteoporosis, depression, hearing loss.    The history is provided by the patient and medical records.    Past Medical History:  Diagnosis Date   High cholesterol    Hypertension    Thyroid disease     There are no problems to display for this patient.   Past Surgical History:  Procedure Laterality Date   TONSILLECTOMY      OB History   No obstetric history on file.      Home Medications    Prior to Admission medications   Medication Sig Start Date End Date Taking? Authorizing Provider  atorvastatin (LIPITOR) 20 MG tablet Take 1 tablet by mouth daily. 10/16/22  Yes [provider]  hydrochlorothiazide (HYDRODIURIL) 25 MG tablet Take 1 tablet by mouth daily. 10/23/22  Yes [provider]  lisinopril (ZESTRIL) 20 MG tablet Take 1 tablet by mouth daily. 11/06/22  Yes [provider]  silver sulfADIAZINE (SILVADENE) 1 % cream Apply 1 Application topically daily. 01/28/23  Yes Mickie Bail, NP  alendronate (FOSAMAX) 70 MG tablet Take 70 mg by mouth once a week.    [provider]  buPROPion (WELLBUTRIN XL) 150 MG 24 hr tablet Take 150 mg by mouth every morning.    [provider]  levothyroxine (SYNTHROID) 88 MCG tablet Take 88 mcg by mouth daily.    [provider]  metoprolol tartrate (LOPRESSOR) 100 MG tablet Take 1 tablet (100 mg total) by mouth once for 1  dose. Please take one time dose 100mg  metoprolol tartrate 2 hr prior to cardiac CT for HR control IF HR >55bpm. Patient not taking: Reported on 01/28/2023 09/22/22 09/22/22  Debbe Odea, MD  venlafaxine XR (EFFEXOR-XR) 75 MG 24 hr capsule Take 75 mg by mouth daily. Patient not taking: Reported on 01/28/2023 10/17/22   [provider]  Vilazodone HCl 20 MG TABS Take 1 tablet by mouth daily. Patient not taking: Reported on 01/28/2023 09/05/22   [provider]    Family History Family History  Problem Relation Age of Onset   Breast cancer Neg Hx     Social History Social History   Tobacco Use   Smoking status: Never   Smokeless tobacco: Never  Vaping Use   Vaping Use: Never used  Substance Use Topics   Alcohol use: Not Currently   Drug use: Never     Allergies   Penicillins and Prozac [fluoxetine]   Review of Systems Review of Systems  Constitutional:  Negative for chills and fever.  Musculoskeletal:  Negative for gait problem and joint swelling.  Skin:  Positive for color change and wound.  Neurological:  Negative for weakness and numbness.     Physical Exam Triage Vital Signs ED Triage Vitals  Enc Vitals Group     BP      Pulse      Resp  Temp      Temp src      SpO2      Weight      Height      Head Circumference      Peak Flow      Pain Score      Pain Loc      Pain Edu?      Excl. in GC?    No data found.  Updated Vital Signs BP 119/78   Pulse 81   Temp 98 F (36.7 C)   Resp 18   SpO2 98%   Visual Acuity Right Eye Distance:   Left Eye Distance:   Bilateral Distance:    Right Eye Near:   Left Eye Near:    Bilateral Near:     Physical Exam Vitals and nursing note reviewed.  Constitutional:      General: She is not in acute distress.    Appearance: She is well-developed.  HENT:     Mouth/Throat:     Mouth: Mucous membranes are moist.  Cardiovascular:     Rate and Rhythm: Normal rate and regular rhythm.      Heart sounds: Normal heart sounds.  Pulmonary:     Effort: Pulmonary effort is normal. No respiratory distress.     Breath sounds: Normal breath sounds.  Musculoskeletal:        General: No swelling or deformity. Normal range of motion.     Cervical back: Neck supple.  Skin:    General: Skin is warm and dry.     Capillary Refill: Capillary refill takes less than 2 seconds.     Findings: Lesion present.     Comments: Burn on anterior right thigh.  See picture.    Neurological:     General: No focal deficit present.     Mental Status: She is alert and oriented to person, place, and time.     Sensory: No sensory deficit.     Motor: No weakness.     Gait: Gait normal.  Psychiatric:        Mood and Affect: Mood normal.        Behavior: Behavior normal.      UC Treatments / Results  Labs (all labs ordered are listed, but only abnormal results are displayed) Labs Reviewed - No data to display  EKG   Radiology No results found.  Procedures Procedures (including critical care time)  Medications Ordered in UC Medications  silver sulfADIAZINE (SILVADENE) 1 % cream ( Topical Given 01/28/23 0823)  Tdap (BOOSTRIX) injection 0.5 mL (0.5 mLs Intramuscular Given 01/28/23 1610)    Initial Impression / Assessment and Plan / UC Course  I have reviewed the triage vital signs and the nursing notes.  Pertinent labs & imaging results that were available during my care of the patient were reviewed by me and considered in my medical decision making (see chart for details).    Partial thickness burn of right anterior thigh.  Afebrile and vital signs are stable.  To the burn on her anterior thigh has been present for 5 days.  Treating today with Silvadene.  Tetanus updated.  Education provided on burn care.  Instructed patient to follow-up with her PCP tomorrow.  ED precautions provided.  She agrees to plan of care.  Final Clinical Impressions(s) / UC Diagnoses   Final diagnoses:  Partial  thickness burn of right thigh, initial encounter     Discharge Instructions      Use the Silvadene  cream as directed.  See the attached information on burn care.   Your tetanus was updated today.    Follow up with your primary care provider tomorrow.        ED Prescriptions     Medication Sig Dispense Auth. Provider   silver sulfADIAZINE (SILVADENE) 1 % cream Apply 1 Application topically daily. 50 g Mickie Bail, NP      PDMP not reviewed this encounter.   Mickie Bail, NP 01/28/23 [phone]

## 2023-01-28 NOTE — Discharge Instructions (Addendum)
Use the Silvadene cream as directed.  See the attached information on burn care.   Your tetanus was updated today.    Follow up with your primary care provider tomorrow.

## 2023-01-28 NOTE — ED Triage Notes (Addendum)
Patient to Urgent Care with complaints of a burn present to the anterior surface of her right thigh. Incident occurred after spilling hot coffee onto herself on Wednesday.   Partial thickness burn/ large red area extends from right hip down half way down her knee. Fluid filled blister present. Has been applying neosporin.

## 2023-07-02 ENCOUNTER — Other Ambulatory Visit: Payer: Self-pay | Admitting: Specialist

## 2023-07-02 DIAGNOSIS — R1312 Dysphagia, oropharyngeal phase: Secondary | ICD-10-CM

## 2023-07-02 DIAGNOSIS — R0602 Shortness of breath: Secondary | ICD-10-CM

## 2023-07-02 DIAGNOSIS — R0989 Other specified symptoms and signs involving the circulatory and respiratory systems: Secondary | ICD-10-CM

## 2023-07-09 ENCOUNTER — Ambulatory Visit
Admission: RE | Admit: 2023-07-09 | Discharge: 2023-07-09 | Disposition: A | Discharge status: Home / Self Care | Payer: Medicare Other | Source: Ambulatory Visit | Attending: Specialist | Admitting: Specialist

## 2023-07-09 DIAGNOSIS — R1312 Dysphagia, oropharyngeal phase: Secondary | ICD-10-CM | POA: Diagnosis present

## 2023-07-09 DIAGNOSIS — R0989 Other specified symptoms and signs involving the circulatory and respiratory systems: Secondary | ICD-10-CM | POA: Insufficient documentation

## 2023-07-09 DIAGNOSIS — R0602 Shortness of breath: Secondary | ICD-10-CM | POA: Diagnosis present

## 2023-07-09 NOTE — Therapy (Signed)
Modified Barium Swallow Study  Patient Details  Name: Robin Marsh MRN: [id number] Date of Birth: 1955/10/06  Today's Date: 07/09/2023  Modified Barium Swallow completed.  Full report located under Chart Review in the Imaging Section.  History of Present Illness Pt is a 67 y.o. female who presents for MBSS due to occasional "choking spells" and c/o "food being stuck in her esophagus." PMHx significant for anxiety, depression, GERD, HLD, HTN, high cholesterol, sleep apnea, and obesity.   Clinical Impression Pt seen for MBSS. Pt demonstrated an intact oropharyngeal swallow with age-related changes to swallowing appreciated (e.g. swallow initiation at the pyriform sinus; transient during the swallow laryngeal penetration which clears with laryngeal vestibule closure). Concern for esophageal component to pt's dysphagia given pt's complaints of globus sensation and regurgitation of soilds (not reproduced on today's evaluation) and retention of barium in the esophagus during an esophageal sweep. Recommend consideration for further GI work up. Recommend continuation of current diet with standard aspiration precautions. No f/u SLP services warranted at this time.  Factors that may increase risk of adverse event in presence of aspiration Rubye Oaks & Clearance Coots 2021): N/A  Swallow Evaluation Recommendations Recommendations: PO diet PO Diet Recommendation: Regular;Thin liquids (Level 0) Liquid Administration via: Spoon;Cup;Straw Medication Administration: Other (Comment) (as tolerated) Supervision: Patient able to self-feed Swallowing strategies  : Slow rate;Small bites/sips Postural changes: Position pt fully upright for meals;Stay upright 30-60 min after meals Oral care recommendations: Oral care BID (2x/day);Pt independent with oral care Recommended consults: Consider GI consultation;Consider esophageal assessment     Clyde Canterbury, M.S., CCC-SLP Speech-Language Pathologist [facility] [phone] (ASCOM)  Alessandra Bevels Saralynn Langhorst 07/09/2023,1:34 PM

## 2023-09-26 NOTE — H&P (Signed)
 Pre-Procedure H&P   Patient ID: Robin Marsh is a 68 y.o. female.  Gastroenterology Provider: [name], DO  Referring Provider: Tawni Pummel, PA PCP: Wilford Corner, PA-C  Date: 09/27/2023  HPI Ms. Robin Marsh is a 68 y.o. female who presents today for Esophagogastroduodenoscopy and Colonoscopy for Dysphagia, personal history of colon polyps .  Patient reports sticking with solids in her upper neck upon swallowing.  She does pick up large pills to help avoid this as well.  No issues with liquid dysphagia or odynophagia.  She does note regurgitation.  She is on Excedrin as needed for headache  Denies any lower GI symptoms at this time.  Denies constipation melena and hematochezia.  Last underwent colonoscopy attempt in January 2023 with 1 adenomatous polyp removed.  Pandiverticulosis, internal and external hemorrhoids were appreciated.  She did have poor prep at that time.  Redundant colon also noted.  Colonoscopy in 2014 unremarkable  Sister with history of colon polyps.  Brother recently diagnosed and unfortunately passed from late stage colon cancer per patient Hemoglobin 14 MCV 99 platelets 231,000 creatinine 1.0   Past Medical History:  Diagnosis Date   High cholesterol    Hypertension    Thyroid disease     Past Surgical History:  Procedure Laterality Date   COLONOSCOPY     TONSILLECTOMY      Family History Sister-colon polyps; Brother recently diagnosed and unfortunately passed from late stage colon cancer per patient No other h/o GI disease or malignancy  Review of Systems  Constitutional:  Negative for activity change, appetite change, chills, diaphoresis, fatigue, fever and unexpected weight change.  HENT:  Positive for trouble swallowing. Negative for voice change.   Respiratory:  Negative for shortness of breath and wheezing.   Cardiovascular:  Negative for chest pain, palpitations and leg swelling.  Gastrointestinal:  Negative  for abdominal distention, abdominal pain, anal bleeding, blood in stool, constipation, diarrhea, nausea, rectal pain and vomiting.  Musculoskeletal:  Negative for arthralgias and myalgias.  Skin:  Negative for color change and pallor.  Neurological:  Negative for dizziness, syncope and weakness.  Psychiatric/Behavioral:  Negative for confusion.   All other systems reviewed and are negative.    Medications No current facility-administered medications on file prior to encounter.   Current Outpatient Medications on File Prior to Encounter  Medication Sig Dispense Refill   atorvastatin (LIPITOR) 20 MG tablet Take 1 tablet by mouth daily.     hydrochlorothiazide (HYDRODIURIL) 25 MG tablet Take 1 tablet by mouth daily.     levothyroxine (SYNTHROID) 88 MCG tablet Take 88 mcg by mouth daily.     alendronate (FOSAMAX) 70 MG tablet Take 70 mg by mouth once a week.     buPROPion (WELLBUTRIN XL) 150 MG 24 hr tablet Take 150 mg by mouth every morning.     lisinopril (ZESTRIL) 20 MG tablet Take 1 tablet by mouth daily.     metoprolol tartrate (LOPRESSOR) 100 MG tablet Take 1 tablet (100 mg total) by mouth once for 1 dose. Please take one time dose 100mg  metoprolol tartrate 2 hr prior to cardiac CT for HR control IF HR >55bpm. (Patient not taking: Reported on 01/28/2023) 1 tablet 0   silver sulfADIAZINE (SILVADENE) 1 % cream Apply 1 Application topically daily. 50 g 1   venlafaxine XR (EFFEXOR-XR) 75 MG 24 hr capsule Take 75 mg by mouth daily. (Patient not taking: Reported on 01/28/2023)     Vilazodone HCl 20 MG TABS  Take 1 tablet by mouth daily. (Patient not taking: Reported on 01/28/2023)      Pertinent medications related to GI and procedure were reviewed by me with the patient prior to the procedure   Current Facility-Administered Medications:    0.9 %  sodium chloride infusion, , Intravenous, Continuous, [name], DO  sodium chloride         Allergies  Allergen Reactions    Penicillins Hives   Prozac [Fluoxetine] Hives   Allergies were reviewed by me prior to the procedure  Objective   Vitals Weight 161.2 lbs Temp 96.75F BP 116/73 mmhG HR 64  Sat 100% Room air 18 resp rate  Physical Exam Vitals and nursing note reviewed.  Constitutional:      General: She is not in acute distress.    Appearance: Normal appearance. She is not ill-appearing, toxic-appearing or diaphoretic.  HENT:     Head: Normocephalic and atraumatic.     Nose: Nose normal.     Mouth/Throat:     Mouth: Mucous membranes are moist.     Pharynx: Oropharynx is clear.  Eyes:     General: No scleral icterus.    Extraocular Movements: Extraocular movements intact.  Cardiovascular:     Rate and Rhythm: Normal rate and regular rhythm.     Heart sounds: Normal heart sounds. No murmur heard.    No friction rub. No gallop.  Pulmonary:     Effort: Pulmonary effort is normal. No respiratory distress.     Breath sounds: Normal breath sounds. No wheezing, rhonchi or rales.  Abdominal:     General: Bowel sounds are normal. There is no distension.     Palpations: Abdomen is soft.     Tenderness: There is no abdominal tenderness. There is no guarding or rebound.  Musculoskeletal:     Cervical back: Neck supple.     Right lower leg: No edema.     Left lower leg: No edema.  Skin:    General: Skin is warm and dry.     Coloration: Skin is not jaundiced or pale.  Neurological:     General: No focal deficit present.     Mental Status: She is alert and oriented to person, place, and time. Mental status is at baseline.  Psychiatric:        Mood and Affect: Mood normal.        Behavior: Behavior normal.        Thought Content: Thought content normal.        Judgment: Judgment normal.      Assessment:  Ms. Robin Marsh is a 68 y.o. female  who presents today for Esophagogastroduodenoscopy and Colonoscopy for Dysphagia, personal history of colon polyps .  Plan:   Esophagogastroduodenoscopy and Colonoscopy with possible intervention today  Esophagogastroduodenoscopy and Colonoscopy with possible biopsy, control of bleeding, polypectomy, and interventions as necessary has been discussed with the patient/patient representative. Informed consent was obtained from the patient/patient representative after explaining the indication, nature, and risks of the procedure including but not limited to death, bleeding, perforation, missed neoplasm/lesions, cardiorespiratory compromise, and reaction to medications. Opportunity for questions was given and appropriate answers were provided. Patient/patient representative has verbalized understanding is amenable to undergoing the procedure.   [name], DO  [facility]  Portions of the record may have been created with voice recognition software. Occasional wrong-word or 'sound-a-like' substitutions may have occurred due to the inherent limitations of voice recognition software.  Read the chart  carefully and recognize, using context, where substitutions may have occurred.

## 2023-09-27 ENCOUNTER — Encounter
Admission: RE | Disposition: A | Discharge status: Home / Self Care | Payer: Self-pay | Source: Home / Self Care | Attending: Gastroenterology

## 2023-09-27 ENCOUNTER — Ambulatory Visit
Admission: RE | Admit: 2023-09-27 | Discharge: 2023-09-27 | Disposition: A | Discharge status: Home / Self Care | Payer: Medicare Other | Attending: Gastroenterology | Admitting: Gastroenterology

## 2023-09-27 ENCOUNTER — Ambulatory Visit: Payer: Medicare Other | Admitting: Anesthesiology

## 2023-09-27 ENCOUNTER — Encounter: Payer: Self-pay | Admitting: Gastroenterology

## 2023-09-27 ENCOUNTER — Other Ambulatory Visit: Payer: Self-pay

## 2023-09-27 DIAGNOSIS — K5730 Diverticulosis of large intestine without perforation or abscess without bleeding: Secondary | ICD-10-CM | POA: Diagnosis not present

## 2023-09-27 DIAGNOSIS — I10 Essential (primary) hypertension: Secondary | ICD-10-CM | POA: Diagnosis not present

## 2023-09-27 DIAGNOSIS — Z83719 Family history of colon polyps, unspecified: Secondary | ICD-10-CM | POA: Diagnosis not present

## 2023-09-27 DIAGNOSIS — Z800 Family history of malignant neoplasm of digestive organs: Secondary | ICD-10-CM | POA: Diagnosis not present

## 2023-09-27 DIAGNOSIS — K2970 Gastritis, unspecified, without bleeding: Secondary | ICD-10-CM | POA: Insufficient documentation

## 2023-09-27 DIAGNOSIS — D128 Benign neoplasm of rectum: Secondary | ICD-10-CM | POA: Diagnosis not present

## 2023-09-27 DIAGNOSIS — E039 Hypothyroidism, unspecified: Secondary | ICD-10-CM | POA: Insufficient documentation

## 2023-09-27 DIAGNOSIS — K2100 Gastro-esophageal reflux disease with esophagitis, without bleeding: Secondary | ICD-10-CM | POA: Insufficient documentation

## 2023-09-27 DIAGNOSIS — R1310 Dysphagia, unspecified: Secondary | ICD-10-CM | POA: Insufficient documentation

## 2023-09-27 DIAGNOSIS — D123 Benign neoplasm of transverse colon: Secondary | ICD-10-CM | POA: Insufficient documentation

## 2023-09-27 DIAGNOSIS — K449 Diaphragmatic hernia without obstruction or gangrene: Secondary | ICD-10-CM | POA: Diagnosis not present

## 2023-09-27 DIAGNOSIS — Z1211 Encounter for screening for malignant neoplasm of colon: Secondary | ICD-10-CM | POA: Diagnosis present

## 2023-09-27 DIAGNOSIS — Z79899 Other long term (current) drug therapy: Secondary | ICD-10-CM | POA: Insufficient documentation

## 2023-09-27 DIAGNOSIS — Z79890 Hormone replacement therapy (postmenopausal): Secondary | ICD-10-CM | POA: Insufficient documentation

## 2023-09-27 DIAGNOSIS — K644 Residual hemorrhoidal skin tags: Secondary | ICD-10-CM | POA: Diagnosis not present

## 2023-09-27 DIAGNOSIS — Q438 Other specified congenital malformations of intestine: Secondary | ICD-10-CM | POA: Insufficient documentation

## 2023-09-27 HISTORY — PX: ESOPHAGOGASTRODUODENOSCOPY (EGD) WITH PROPOFOL: SHX5813

## 2023-09-27 HISTORY — PX: POLYPECTOMY: SHX5525

## 2023-09-27 HISTORY — PX: BIOPSY: SHX5522

## 2023-09-27 HISTORY — PX: COLONOSCOPY WITH PROPOFOL: SHX5780

## 2023-09-27 SURGERY — COLONOSCOPY WITH PROPOFOL
Anesthesia: General

## 2023-09-27 MED ORDER — PROPOFOL 10 MG/ML IV BOLUS
INTRAVENOUS | Status: AC
  Filled 2023-09-27: qty 20

## 2023-09-27 MED ORDER — LIDOCAINE HCL (CARDIAC) PF 100 MG/5ML IV SOSY
PREFILLED_SYRINGE | INTRAVENOUS | Status: DC | PRN
  Administered 2023-09-27: 60 mg via INTRAVENOUS

## 2023-09-27 MED ORDER — PROPOFOL 500 MG/50ML IV EMUL
INTRAVENOUS | Status: DC | PRN
  Administered 2023-09-27: 25 mg via INTRAVENOUS
  Administered 2023-09-27: 75 mg via INTRAVENOUS
  Administered 2023-09-27 (×4): 25 mg via INTRAVENOUS
  Administered 2023-09-27: 100 ug/kg/min via INTRAVENOUS
  Administered 2023-09-27: 25 mg via INTRAVENOUS
  Administered 2023-09-27: 40 mg via INTRAVENOUS
  Administered 2023-09-27: 50 mg via INTRAVENOUS

## 2023-09-27 MED ORDER — PHENYLEPHRINE 80 MCG/ML (10ML) SYRINGE FOR IV PUSH (FOR BLOOD PRESSURE SUPPORT)
PREFILLED_SYRINGE | INTRAVENOUS | Status: DC | PRN
  Administered 2023-09-27 (×3): 80 ug via INTRAVENOUS

## 2023-09-27 MED ORDER — LIDOCAINE HCL (PF) 2 % IJ SOLN
INTRAMUSCULAR | Status: AC
  Filled 2023-09-27: qty 5

## 2023-09-27 MED ORDER — PHENYLEPHRINE 80 MCG/ML (10ML) SYRINGE FOR IV PUSH (FOR BLOOD PRESSURE SUPPORT)
PREFILLED_SYRINGE | INTRAVENOUS | Status: AC
  Filled 2023-09-27: qty 10

## 2023-09-27 MED ORDER — SODIUM CHLORIDE 0.9 % IV SOLN: INTRAVENOUS | Status: DC

## 2023-09-27 NOTE — Anesthesia Preprocedure Evaluation (Addendum)
Anesthesia Evaluation  Patient identified by MRN, date of birth, ID band Patient awake    Reviewed: Allergy & Precautions, H&P , NPO status , Patient's Chart, lab work & pertinent test results  Airway Mallampati: II  TM Distance: >3 FB Neck ROM: full    Dental  (+) Edentulous Upper, Edentulous Lower   Pulmonary neg pulmonary ROS   Pulmonary exam normal        Cardiovascular hypertension, Normal cardiovascular exam     Neuro/Psych  PSYCHIATRIC DISORDERS      negative neurological ROS     GI/Hepatic negative GI ROS, Neg liver ROS,,,  Endo/Other  Hypothyroidism    Renal/GU negative Renal ROS  negative genitourinary   Musculoskeletal   Abdominal Normal abdominal exam  (+)   Peds  Hematology negative hematology ROS (+)   Anesthesia Other Findings Past Medical History: No date: High cholesterol No date: Hypertension No date: Thyroid disease  Past Surgical History: No date: TONSILLECTOMY     Reproductive/Obstetrics negative OB ROS                             Anesthesia Physical Anesthesia Plan  ASA: 2  Anesthesia Plan: General   Post-op Pain Management:    Induction:   PONV Risk Score and Plan: Propofol infusion and TIVA  Airway Management Planned:   Additional Equipment:   Intra-op Plan:   Post-operative Plan:   Informed Consent: I have reviewed the patients History and Physical, chart, labs and discussed the procedure including the risks, benefits and alternatives for the proposed anesthesia with the patient or authorized representative who has indicated his/her understanding and acceptance.     Dental Advisory Given  Plan Discussed with: CRNA and Surgeon  Anesthesia Plan Comments:         Anesthesia Quick Evaluation

## 2023-09-27 NOTE — Op Note (Signed)
[facility] Patient Name: Robin Marsh Procedure Date: 09/27/2023 12:02 PM MRN: [id number] Account #: [id number] Date of Birth: 03-10-1956 Admit Type: Outpatient Age: 68 Room: [facility] ENDO ROOM 1 Gender: Female Note Status: Finalized Instrument Name: Upper Endoscope [id number] Procedure:             Upper GI endoscopy Indications:           Dysphagia Providers:             [name] DO, DO Referring MD:          Wilford Corner (Referring MD) Medicines:             Monitored Anesthesia Care Complications:         No immediate complications. Estimated blood loss:                         Minimal. Procedure:             Pre-Anesthesia Assessment:                        - Prior to the procedure, a History and Physical was                         performed, and patient medications and allergies were                         reviewed. The patient is competent. The risks and                         benefits of the procedure and the sedation options and                         risks were discussed with the patient. All questions                         were answered and informed consent was obtained.                         Patient identification and proposed procedure were                         verified by the physician, the nurse, the anesthetist                         and the technician in the endoscopy suite. Mental                         Status Examination: alert and oriented. Airway                         Examination: normal oropharyngeal airway and neck                         mobility. Respiratory Examination: clear to                         auscultation. CV Examination: RRR, no murmurs, no S3  or S4. Prophylactic Antibiotics: The patient does not                         require prophylactic antibiotics. Prior                         Anticoagulants: The patient has taken no anticoagulant                          or antiplatelet agents. ASA Grade Assessment: III - A                         patient with severe systemic disease. After reviewing                         the risks and benefits, the patient was deemed in                         satisfactory condition to undergo the procedure. The                         anesthesia plan was to use monitored anesthesia care                         (MAC). Immediately prior to administration of                         medications, the patient was re-assessed for adequacy                         to receive sedatives. The heart rate, respiratory                         rate, oxygen saturations, blood pressure, adequacy of                         pulmonary ventilation, and response to care were                         monitored throughout the procedure. The physical                         status of the patient was re-assessed after the                         procedure.                        After obtaining informed consent, the endoscope was                         passed under direct vision. Throughout the procedure,                         the patient's blood pressure, pulse, and oxygen                         saturations were monitored continuously. The Endoscope  was introduced through the mouth, and advanced to the                         second part of duodenum. The upper GI endoscopy was                         accomplished without difficulty. The patient tolerated                         the procedure well. Findings:      The duodenal bulb, first portion of the duodenum and second portion of       the duodenum were normal. Estimated blood loss: none.      Localized moderate inflammation characterized by erythema was found in       the gastric antrum. Biopsies were taken with a cold forceps for       Helicobacter pylori testing. Estimated blood loss was minimal.      A 4 cm hiatal hernia was present. Estimated blood loss:  none.      The exam of the stomach was otherwise normal.      The Z-line was regular. Estimated blood loss: none.      Esophagogastric landmarks were identified: the gastroesophageal junction       was found at 33 cm from the incisors.      Moderately severe esophagitis with no bleeding was found. Biopsies were       obtained from the proximal and distal esophagus with cold forceps for       histology of suspected eosinophilic esophagitis. Estimated blood loss       was minimal.      The exam of the esophagus was otherwise normal. Impression:            - Normal duodenal bulb, first portion of the duodenum                         and second portion of the duodenum.                        - Gastritis. Biopsied.                        - 4 cm hiatal hernia.                        - Z-line regular.                        - Esophagogastric landmarks identified.                        - Moderately severe esophagitis with no bleeding.                        - Biopsies were taken with a cold forceps for                         evaluation of eosinophilic esophagitis. Recommendation:        - Patient has a contact number available for  emergencies. The signs and symptoms of potential                         delayed complications were discussed with the patient.                         Return to normal activities tomorrow. Written                         discharge instructions were provided to the patient.                        - Discharge patient to home.                        - Resume previous diet.                        - Continue present medications.                        - No ibuprofen, naproxen, or other non-steroidal                         anti-inflammatory drugs.                        - Await pathology results.                        - Repeat upper endoscopy at appointment to be                         scheduled to evaluate the response to therapy.                         - Return to GI clinic as previously scheduled.                        - proceed with colonoscopy. see report for further                         recommendations.                        - The findings and recommendations were discussed with                         the patient. Procedure Code(s):     --- Professional ---                        [phone], Esophagogastroduodenoscopy, flexible,                         transoral; with biopsy, single or multiple Diagnosis Code(s):     --- Professional ---                        K29.70, Gastritis, unspecified, without bleeding                        K44.9, Diaphragmatic hernia without  obstruction or                         gangrene                        K20.90, Esophagitis, unspecified without bleeding                        R13.10, Dysphagia, unspecified CPT copyright 2022 American Medical Association. All rights reserved. The codes documented in this report are preliminary and upon coder review may  be revised to meet current compliance requirements. Attending Participation:      I personally performed the entire procedure. Elfredia Nevins, DO [name] DO, DO 09/27/2023 1:04:56 PM This report has been signed electronically. Number of Addenda: 0 Note Initiated On: 09/27/2023 12:02 PM Estimated Blood Loss:  Estimated blood loss was minimal.      [facility]

## 2023-09-27 NOTE — Interval H&P Note (Signed)
History and Physical Interval Note: Preprocedure H&P from 09/27/23  was reviewed and there was no interval change after seeing and examining the patient.  Written consent was obtained from the patient after discussion of risks, benefits, and alternatives. Patient has consented to proceed with Esophagogastroduodenoscopy and Colonoscopy with possible intervention   09/27/2023 12:40 PM  Robin Marsh  has presented today for surgery, with the diagnosis of R13.10 (ICD-10-CM) - Dysphagia, unspecified type Z86.0101 (ICD-10-CM) - Personal history of adenomatous and serrated colon polyps.  The various methods of treatment have been discussed with the patient and family. After consideration of risks, benefits and other options for treatment, the patient has consented to  Procedure(s): COLONOSCOPY WITH PROPOFOL (N/A) ESOPHAGOGASTRODUODENOSCOPY (EGD) WITH PROPOFOL (N/A) as a surgical intervention.  The patient's history has been reviewed, patient examined, no change in status, stable for surgery.  I have reviewed the patient's chart and labs.  Questions were answered to the patient's satisfaction.     [name]

## 2023-09-27 NOTE — Anesthesia Postprocedure Evaluation (Signed)
Anesthesia Post Note  Patient: Robin Marsh  Procedure(s) Performed: COLONOSCOPY WITH PROPOFOL ESOPHAGOGASTRODUODENOSCOPY (EGD) WITH PROPOFOL BIOPSY POLYPECTOMY  Patient location during evaluation: PACU Anesthesia Type: General Level of consciousness: awake and alert Pain management: pain level controlled Vital Signs Assessment: post-procedure vital signs reviewed and stable Respiratory status: spontaneous breathing, nonlabored ventilation and respiratory function stable Cardiovascular status: blood pressure returned to baseline and stable Postop Assessment: no apparent nausea or vomiting Anesthetic complications: no   No notable events documented.   Last Vitals:  Vitals:   09/27/23 1335 09/27/23 1356  BP: (!) 85/39 (!) 112/45  Pulse: 65 (!) 48  Resp: 11 13  Temp: (!) 35.4 C   SpO2: 100% 100%    Last Pain:  Vitals:   09/27/23 1356  TempSrc:   PainSc: 0-No pain                 Foye Deer

## 2023-09-27 NOTE — OR Nursing (Signed)
Pt's ride is a "[employer]Animator. Will have driver wait in lobby until pt ready to leave. Discussed with pt.

## 2023-09-27 NOTE — Transfer of Care (Signed)
Immediate Anesthesia Transfer of Care Note  Patient: Robin Marsh  Procedure(s) Performed: COLONOSCOPY WITH PROPOFOL ESOPHAGOGASTRODUODENOSCOPY (EGD) WITH PROPOFOL BIOPSY POLYPECTOMY  Patient Location: PACU and Endoscopy Unit  Anesthesia Type:General  Level of Consciousness: awake  Airway & Oxygen Therapy: Patient Spontanous Breathing  Post-op Assessment: Report given to RN and Post -op Vital signs reviewed and stable  Post vital signs: Reviewed and stable  Last Vitals:  Vitals Value Taken Time  BP 84/36 09/27/23 1337  Temp 35.4 C 09/27/23 1335  Pulse 66 09/27/23 1341  Resp 12 09/27/23 1341  SpO2 100 % 09/27/23 1341  Vitals shown include unfiled device data.  Last Pain:  Vitals:   09/27/23 1335  TempSrc:   PainSc: Asleep         Complications: No notable events documented.

## 2023-09-27 NOTE — Op Note (Signed)
 [facility] Gastroenterology Patient Name: Robin Marsh Procedure Date: 09/27/2023 12:01 PM MRN: [id number] Account #: [id number] Date of Birth: 08-May-1956 Admit Type: Outpatient Age: 68 Room: [facility] ENDO ROOM 1 Gender: Female Note Status: Finalized Instrument Name: Peds Colonoscope [id number] Procedure:             Colonoscopy Indications:           High risk colon cancer surveillance: Personal history                         of colonic polyps, Family history of colon cancer and                         colon polyps Providers:             Trenda Moots, DO Referring MD:          Wilford Corner (Referring MD) Medicines:             Monitored Anesthesia Care Complications:         No immediate complications. Estimated blood loss:                         Minimal. Procedure:             Pre-Anesthesia Assessment:                        - Prior to the procedure, a History and Physical was                         performed, and patient medications and allergies were                         reviewed. The patient is competent. The risks and                         benefits of the procedure and the sedation options and                         risks were discussed with the patient. All questions                         were answered and informed consent was obtained.                         Patient identification and proposed procedure were                         verified by the physician, the nurse, the anesthetist                         and the technician in the endoscopy suite. Mental                         Status Examination: alert and oriented. Airway                         Examination: normal oropharyngeal airway and neck  mobility. Respiratory Examination: clear to                         auscultation. CV Examination: RRR, no murmurs, no S3                         or S4. Prophylactic Antibiotics: The patient does not                          require prophylactic antibiotics. Prior                         Anticoagulants: The patient has taken no anticoagulant                         or antiplatelet agents. ASA Grade Assessment: III - A                         patient with severe systemic disease. After reviewing                         the risks and benefits, the patient was deemed in                         satisfactory condition to undergo the procedure. The                         anesthesia plan was to use monitored anesthesia care                         (MAC). Immediately prior to administration of                         medications, the patient was re-assessed for adequacy                         to receive sedatives. The heart rate, respiratory                         rate, oxygen saturations, blood pressure, adequacy of                         pulmonary ventilation, and response to care were                         monitored throughout the procedure. The physical                         status of the patient was re-assessed after the                         procedure.                        After obtaining informed consent, the colonoscope was                         passed under direct vision. Throughout the procedure,  the patient's blood pressure, pulse, and oxygen                         saturations were monitored continuously. The                         Colonoscope was introduced through the anus and                         advanced to the the cecum, identified by appendiceal                         orifice and ileocecal valve. The colonoscopy was                         somewhat difficult due to multiple diverticula in the                         colon and a redundant colon. Successful completion of                         the procedure was aided by applying abdominal pressure                         and lavage. The patient tolerated the procedure well.                          The quality of the bowel preparation was evaluated                         using the BBPS [facility] Bowel Preparation Scale) with                         scores of: Right Colon = 2 (minor amount of residual                         staining, small fragments of stool and/or opaque                         liquid, but mucosa seen well), Transverse Colon = 2                         (minor amount of residual staining, small fragments of                         stool and/or opaque liquid, but mucosa seen well) and                         Left Colon = 2 (minor amount of residual staining,                         small fragments of stool and/or opaque liquid, but                         mucosa seen well). The total BBPS score equals 6. The  quality of the bowel preparation was good. The                         ileocecal valve, appendiceal orifice, and rectum were                         photographed. Findings:      Hemorrhoids were found on perianal exam.      The digital rectal exam was normal. Pertinent negatives include normal       sphincter tone.      Three sessile polyps were found in the rectum (2) and transverse colon.       The polyps were 1 to 2 mm in size. These polyps were removed with a       jumbo cold forceps. Resection and retrieval were complete. Estimated       blood loss was minimal.      Multiple small-mouthed diverticula were found in the entire colon.       Estimated blood loss: none.      The exam was otherwise without abnormality on direct and retroflexion       views. Impression:            - Hemorrhoids found on perianal exam.                        - Three 1 to 2 mm polyps in the rectum and in the                         transverse colon, removed with a jumbo cold forceps.                         Resected and retrieved.                        - Diverticulosis in the entire examined colon.                        - The examination was otherwise  normal on direct and                         retroflexion views. Recommendation:        - Patient has a contact number available for                         emergencies. The signs and symptoms of potential                         delayed complications were discussed with the patient.                         Return to normal activities tomorrow. Written                         discharge instructions were provided to the patient.                        - Discharge patient to home.                        -  Resume previous diet.                        - Continue present medications.                        - Await pathology results.                        - Repeat colonoscopy in 5 years for surveillance based                         on pathology results.                        - Return to GI office as previously scheduled.                        - The findings and recommendations were discussed with                         the patient. Procedure Code(s):     --- Professional ---                        [phone], Colonoscopy, flexible; with biopsy, single or                         multiple Diagnosis Code(s):     --- Professional ---                        Z86.010, Personal history of colonic polyps                        D12.8, Benign neoplasm of rectum                        D12.3, Benign neoplasm of transverse colon (hepatic                         flexure or splenic flexure)                        K64.9, Unspecified hemorrhoids                        K57.30, Diverticulosis of large intestine without                         perforation or abscess without bleeding CPT copyright 2022 American Medical Association. All rights reserved. The codes documented in this report are preliminary and upon coder review may  be revised to meet current compliance requirements. Attending Participation:      I personally performed the entire procedure. Elfredia Nevins, DO [name] DO, DO 09/27/2023  1:37:35 PM This report has been signed electronically. Number of Addenda: 0 Note Initiated On: 09/27/2023 12:01 PM Scope Withdrawal Time: 0 hours 12 minutes 53 seconds  Total Procedure Duration: 0 hours 23 minutes 51 seconds  Estimated Blood Loss:  Estimated blood loss was minimal.      [facility]

## 2023-09-28 ENCOUNTER — Encounter: Payer: Self-pay | Admitting: Gastroenterology

## 2023-09-28 LAB — SURGICAL PATHOLOGY

## 2023-10-03 ENCOUNTER — Other Ambulatory Visit: Payer: Self-pay | Admitting: Medical Genetics

## 2023-10-05 ENCOUNTER — Other Ambulatory Visit
Admission: RE | Admit: 2023-10-05 | Discharge: 2023-10-05 | Disposition: A | Discharge status: Home / Self Care | Payer: Self-pay | Source: Ambulatory Visit | Attending: Medical Genetics | Admitting: Medical Genetics

## 2023-10-15 LAB — GENECONNECT MOLECULAR SCREEN: Genetic Analysis Overall Interpretation: NEGATIVE

## 2023-11-06 ENCOUNTER — Encounter: Payer: Self-pay | Admitting: Gastroenterology

## 2023-11-09 ENCOUNTER — Encounter: Payer: Self-pay | Admitting: Gastroenterology

## 2023-11-15 ENCOUNTER — Ambulatory Visit: Admitting: Anesthesiology

## 2023-11-15 ENCOUNTER — Encounter
Admission: RE | Disposition: A | Discharge status: Home / Self Care | Payer: Self-pay | Source: Home / Self Care | Attending: Gastroenterology

## 2023-11-15 ENCOUNTER — Encounter: Payer: Self-pay | Admitting: Gastroenterology

## 2023-11-15 ENCOUNTER — Other Ambulatory Visit: Payer: Self-pay

## 2023-11-15 ENCOUNTER — Ambulatory Visit
Admission: RE | Admit: 2023-11-15 | Discharge: 2023-11-15 | Disposition: A | Discharge status: Home / Self Care | Attending: Gastroenterology | Admitting: Gastroenterology

## 2023-11-15 DIAGNOSIS — R1310 Dysphagia, unspecified: Secondary | ICD-10-CM | POA: Insufficient documentation

## 2023-11-15 DIAGNOSIS — I10 Essential (primary) hypertension: Secondary | ICD-10-CM | POA: Diagnosis not present

## 2023-11-15 DIAGNOSIS — K219 Gastro-esophageal reflux disease without esophagitis: Secondary | ICD-10-CM | POA: Insufficient documentation

## 2023-11-15 DIAGNOSIS — Z87891 Personal history of nicotine dependence: Secondary | ICD-10-CM | POA: Insufficient documentation

## 2023-11-15 DIAGNOSIS — K222 Esophageal obstruction: Secondary | ICD-10-CM | POA: Diagnosis not present

## 2023-11-15 DIAGNOSIS — K449 Diaphragmatic hernia without obstruction or gangrene: Secondary | ICD-10-CM | POA: Insufficient documentation

## 2023-11-15 HISTORY — DX: Depression, unspecified: F32.A

## 2023-11-15 HISTORY — DX: Benign neoplasm of connective and other soft tissue, unspecified: D21.9

## 2023-11-15 HISTORY — DX: Age-related osteoporosis without current pathological fracture: M81.0

## 2023-11-15 HISTORY — DX: Hypothyroidism, unspecified: E03.9

## 2023-11-15 HISTORY — DX: Polyp of colon: K63.5

## 2023-11-15 HISTORY — DX: Gastro-esophageal reflux disease without esophagitis: K21.9

## 2023-11-15 HISTORY — PX: ESOPHAGEAL DILATION: SHX303

## 2023-11-15 HISTORY — DX: Hemochromatosis, unspecified: E83.119

## 2023-11-15 HISTORY — DX: Gastro-esophageal reflux disease with esophagitis, without bleeding: K21.00

## 2023-11-15 HISTORY — PX: ESOPHAGOGASTRODUODENOSCOPY: SHX5428

## 2023-11-15 HISTORY — DX: Asymptomatic menopausal state: Z78.0

## 2023-11-15 HISTORY — DX: Effusion, left ankle: M25.472

## 2023-11-15 HISTORY — DX: Major depressive disorder, single episode, severe without psychotic features: F32.2

## 2023-11-15 SURGERY — EGD (ESOPHAGOGASTRODUODENOSCOPY)
Anesthesia: General

## 2023-11-15 MED ORDER — GLYCOPYRROLATE 0.2 MG/ML IJ SOLN
INTRAMUSCULAR | Status: DC | PRN
  Administered 2023-11-15: .2 mg via INTRAVENOUS

## 2023-11-15 MED ORDER — SODIUM CHLORIDE 0.9 % IV SOLN: INTRAVENOUS | Status: DC

## 2023-11-15 MED ORDER — PROPOFOL 10 MG/ML IV BOLUS
INTRAVENOUS | Status: DC | PRN
Start: 2023-11-15 — End: 2023-11-15
  Administered 2023-11-15: 20 mg via INTRAVENOUS
  Administered 2023-11-15: 60 mg via INTRAVENOUS
  Administered 2023-11-15: 20 mg via INTRAVENOUS

## 2023-11-15 MED ORDER — PROPOFOL 500 MG/50ML IV EMUL
INTRAVENOUS | Status: DC | PRN
  Administered 2023-11-15: 145 ug/kg/min via INTRAVENOUS

## 2023-11-15 MED ORDER — LIDOCAINE HCL (CARDIAC) PF 100 MG/5ML IV SOSY
PREFILLED_SYRINGE | INTRAVENOUS | Status: DC | PRN
  Administered 2023-11-15: 100 mg via INTRAVENOUS

## 2023-11-15 NOTE — Anesthesia Procedure Notes (Signed)
 Procedure Name: General with mask airway Date/Time: 11/15/2023 12:13 PM  Performed by: Mohammed Kindle, CRNAPre-anesthesia Checklist: Patient identified, Emergency Drugs available, Suction available and Patient being monitored Patient Re-evaluated:Patient Re-evaluated prior to induction Oxygen Delivery Method: Simple face mask Induction Type: IV induction Placement Confirmation: positive ETCO2 and breath sounds checked- equal and bilateral Dental Injury: Teeth and Oropharynx as per pre-operative assessment

## 2023-11-15 NOTE — Anesthesia Preprocedure Evaluation (Signed)
 Anesthesia Evaluation  Patient identified by MRN, date of birth, ID band Patient awake    Reviewed: Allergy & Precautions, H&P , NPO status , Patient's Chart, lab work & pertinent test results  History of Anesthesia Complications Negative for: history of anesthetic complications  Airway Mallampati: II  TM Distance: >3 FB Neck ROM: full    Dental  (+) Edentulous Upper, Edentulous Lower   Pulmonary neg pulmonary ROS, former smoker   Pulmonary exam normal        Cardiovascular hypertension, Pt. on medications Normal cardiovascular exam     Neuro/Psych  PSYCHIATRIC DISORDERS  Depression    negative neurological ROS     GI/Hepatic Neg liver ROS,GERD  ,,  Endo/Other  Hypothyroidism    Renal/GU negative Renal ROS  negative genitourinary   Musculoskeletal negative musculoskeletal ROS (+)    Abdominal Normal abdominal exam  (+)   Peds  Hematology negative hematology ROS (+)   Anesthesia Other Findings Past Medical History: No date: High cholesterol No date: Hypertension No date: Thyroid disease  Past Surgical History: No date: TONSILLECTOMY     Reproductive/Obstetrics negative OB ROS                             Anesthesia Physical Anesthesia Plan  ASA: 2  Anesthesia Plan: General   Post-op Pain Management:    Induction:   PONV Risk Score and Plan: Propofol infusion and TIVA  Airway Management Planned:   Additional Equipment:   Intra-op Plan:   Post-operative Plan:   Informed Consent: I have reviewed the patients History and Physical, chart, labs and discussed the procedure including the risks, benefits and alternatives for the proposed anesthesia with the patient or authorized representative who has indicated his/her understanding and acceptance.     Dental Advisory Given  Plan Discussed with: CRNA and Surgeon  Anesthesia Plan Comments:         Anesthesia Quick  Evaluation

## 2023-11-15 NOTE — Anesthesia Postprocedure Evaluation (Signed)
 Anesthesia Post Note  Patient: Robin Marsh  Procedure(s) Performed: EGD (ESOPHAGOGASTRODUODENOSCOPY) ESOPHAGOSCOPY, WITH ESOPHAGEAL DILATION  Patient location during evaluation: PACU Anesthesia Type: General Level of consciousness: awake and alert Pain management: pain level controlled Vital Signs Assessment: post-procedure vital signs reviewed and stable Respiratory status: spontaneous breathing, nonlabored ventilation and respiratory function stable Cardiovascular status: blood pressure returned to baseline and stable Postop Assessment: no apparent nausea or vomiting Anesthetic complications: no   No notable events documented.   Last Vitals:  Vitals:   11/15/23 1241 11/15/23 1251  BP: 117/73 132/72  Pulse: 72 (!) 58  Resp: 16 16  Temp:    SpO2: 100% 100%    Last Pain:  Vitals:   11/15/23 1251  TempSrc:   PainSc: 0-No pain                 Foye Deer

## 2023-11-15 NOTE — H&P (Signed)
 Pre-Procedure H&P   Patient ID: Robin Marsh is a 68 y.o. female.  Gastroenterology Provider: [name], DO  Referring Provider: Tawni Pummel, PA PCP: Wilford Corner, PA-C  Date: 11/15/2023  HPI Robin Marsh is a 68 y.o. female who presents today for Esophagogastroduodenoscopy for Esophagitis follow-up, GERD, dysphagia.  Patient was seen in the office for dysphagia and worsening GERD symptoms.  She underwent EGD in February demonstrating significant esophagitis.  Biopsies were negative for EOE.  Gastric biopsies negative for H. pylori.  A regular Z-line was appreciated at the GEJ at 33 cm.  A 4 cm hiatal hernia was also appreciated.  She has since been on twice a day Protonix with improvement in symptoms.  Previously had regurgitation.  Previous dysphagia was to solids and pills.  No issues with liquids or odynophagia.  No melena or hematochezia   Past Medical History:  Diagnosis Date   Colon polyps    Depression    Fibroids    GERD (gastroesophageal reflux disease)    Hemochromatosis    High cholesterol    Hypertension    Hypothyroidism    Left ankle effusion    MDD (major depressive disorder), severe (HCC)    Menopause    Osteoporosis    Reflux esophagitis    Thyroid disease     Past Surgical History:  Procedure Laterality Date   BIOPSY  09/27/2023   Procedure: BIOPSY;  Surgeon: [name], DO;  Location: [facility] ENDOSCOPY;  Service: Gastroenterology;;   COLONOSCOPY     COLONOSCOPY WITH PROPOFOL N/A 09/27/2023   Procedure: COLONOSCOPY WITH PROPOFOL;  Surgeon: [name], DO;  Location: [facility] ENDOSCOPY;  Service: Gastroenterology;  Laterality: N/A;   ESOPHAGOGASTRODUODENOSCOPY (EGD) WITH PROPOFOL N/A 09/27/2023   Procedure: ESOPHAGOGASTRODUODENOSCOPY (EGD) WITH PROPOFOL;  Surgeon: [name], DO;  Location: [facility] ENDOSCOPY;  Service: Gastroenterology;  Laterality: N/A;   POLYPECTOMY  09/27/2023   Procedure:  POLYPECTOMY;  Surgeon: [name], DO;  Location: [facility] ENDOSCOPY;  Service: Gastroenterology;;   TONSILLECTOMY      Family History Fhx hemochromatosis No other h/o GI disease or malignancy  Review of Systems  Constitutional:  Negative for activity change, appetite change, chills, diaphoresis, fatigue, fever and unexpected weight change.  HENT:  Positive for trouble swallowing. Negative for voice change.   Respiratory:  Negative for shortness of breath and wheezing.   Cardiovascular:  Negative for chest pain, palpitations and leg swelling.  Gastrointestinal:  Negative for abdominal distention, abdominal pain, anal bleeding, blood in stool, constipation, diarrhea, nausea, rectal pain and vomiting.  Musculoskeletal:  Negative for arthralgias and myalgias.  Skin:  Negative for color change and pallor.  Neurological:  Negative for dizziness, syncope and weakness.  Psychiatric/Behavioral:  Negative for confusion.   All other systems reviewed and are negative.    Medications No current facility-administered medications on file prior to encounter.   Current Outpatient Medications on File Prior to Encounter  Medication Sig Dispense Refill   alendronate (FOSAMAX) 70 MG tablet Take 70 mg by mouth once a week.     atorvastatin (LIPITOR) 20 MG tablet Take 1 tablet by mouth daily.     buPROPion (WELLBUTRIN XL) 150 MG 24 hr tablet Take 150 mg by mouth every morning.     hydrochlorothiazide (HYDRODIURIL) 25 MG tablet Take 1 tablet by mouth daily.     levothyroxine (SYNTHROID) 88 MCG tablet Take 88 mcg by mouth daily.     lisinopril (ZESTRIL) 20 MG  tablet Take 1 tablet by mouth daily.     metoprolol tartrate (LOPRESSOR) 100 MG tablet Take 1 tablet (100 mg total) by mouth once for 1 dose. Please take one time dose 100mg  metoprolol tartrate 2 hr prior to cardiac CT for HR control IF HR >55bpm. (Patient not taking: Reported on 01/28/2023) 1 tablet 0   silver sulfADIAZINE (SILVADENE) 1 % cream  Apply 1 Application topically daily. 50 g 1   venlafaxine XR (EFFEXOR-XR) 75 MG 24 hr capsule Take 75 mg by mouth daily. (Patient not taking: Reported on 01/28/2023)     Vilazodone HCl 20 MG TABS Take 1 tablet by mouth daily. (Patient not taking: Reported on 01/28/2023)      Pertinent medications related to GI and procedure were reviewed by me with the patient prior to the procedure   Current Facility-Administered Medications:    0.9 %  sodium chloride infusion, , Intravenous, Continuous, [name], DO, Last Rate: 20 mL/hr at 11/15/23 1141, Restarted at 11/15/23 1205  Facility-Administered Medications Ordered in Other Encounters:    glycopyrrolate (ROBINUL) injection, , Intravenous, Anesthesia Intra-op, Fletcher-Harrison, Tawana, CRNA, 0.2 mg at 11/15/23 1202  sodium chloride 20 mL/hr at 11/15/23 1141       Allergies  Allergen Reactions   Penicillins Hives   Prozac [Fluoxetine] Hives   Allergies were reviewed by me prior to the procedure  Objective   Body mass index is 27.76 kg/m. Vitals:   11/15/23 1131  BP: 129/66  Pulse: 74  Resp: 18  Temp: (!) 96.8 F (36 C)  TempSrc: Temporal  SpO2: 99%  Weight: 75.7 kg  Height: 5\' 5"  (1.651 m)     Physical Exam Vitals and nursing note reviewed.  Constitutional:      General: She is not in acute distress.    Appearance: Normal appearance. She is not ill-appearing, toxic-appearing or diaphoretic.  HENT:     Head: Normocephalic and atraumatic.     Nose: Nose normal.     Mouth/Throat:     Mouth: Mucous membranes are moist.     Pharynx: Oropharynx is clear.  Eyes:     General: No scleral icterus.    Extraocular Movements: Extraocular movements intact.  Cardiovascular:     Rate and Rhythm: Normal rate and regular rhythm.     Heart sounds: Normal heart sounds. No murmur heard.    No friction rub. No gallop.  Pulmonary:     Effort: Pulmonary effort is normal. No respiratory distress.     Breath sounds: Normal  breath sounds. No wheezing, rhonchi or rales.  Abdominal:     General: Bowel sounds are normal. There is no distension.     Palpations: Abdomen is soft.     Tenderness: There is no abdominal tenderness. There is no guarding or rebound.  Musculoskeletal:     Cervical back: Neck supple.     Right lower leg: No edema.     Left lower leg: No edema.  Skin:    General: Skin is warm and dry.     Coloration: Skin is not jaundiced or pale.  Neurological:     General: No focal deficit present.     Mental Status: She is alert and oriented to person, place, and time. Mental status is at baseline.  Psychiatric:        Mood and Affect: Mood normal.        Behavior: Behavior normal.        Thought Content: Thought content normal.  Judgment: Judgment normal.      Assessment:  Robin Marsh is a 68 y.o. female  who presents today for Esophagogastroduodenoscopy for Esophagitis follow-up, GERD, dysphagia .  Plan:  Esophagogastroduodenoscopy with possible intervention today  Esophagogastroduodenoscopy with possible biopsy, control of bleeding, polypectomy, and interventions as necessary has been discussed with the patient/patient representative. Informed consent was obtained from the patient/patient representative after explaining the indication, nature, and risks of the procedure including but not limited to death, bleeding, perforation, missed neoplasm/lesions, cardiorespiratory compromise, and reaction to medications. Opportunity for questions was given and appropriate answers were provided. Patient/patient representative has verbalized understanding is amenable to undergoing the procedure.   [name], DO  [facility] Gastroenterology  Portions of the record may have been created with voice recognition software. Occasional wrong-word or 'sound-a-like' substitutions may have occurred due to the inherent limitations of voice recognition software.  Read the chart carefully  and recognize, using context, where substitutions may have occurred.

## 2023-11-15 NOTE — Op Note (Signed)
 [facility]astroenterology Patient Name: Robin Marsh Procedure Date: 11/15/2023 11:19 AM MRN: [id number] Account #: [id number] Date of Birth: 20-Mar-1956 Admit Type: Outpatient Age: 68 Room: [facility] ENDO ROOM 1 Gender: Female Note Status: Finalized Instrument Name: Upper Endoscope [id number] Procedure:             Upper GI endoscopy Indications:           Dysphagia, Follow-up of esophagitis Providers:             Trenda Moots, DO Referring MD:          Wilford Corner (Referring MD) Medicines:             Monitored Anesthesia Care Complications:         No immediate complications. Estimated blood loss: None. Procedure:             Pre-Anesthesia Assessment:                        - Prior to the procedure, a History and Physical was                         performed, and patient medications and allergies were                         reviewed. The patient is competent. The risks and                         benefits of the procedure and the sedation options and                         risks were discussed with the patient. All questions                         were answered and informed consent was obtained.                         Patient identification and proposed procedure were                         verified by the physician, the nurse, the anesthetist                         and the technician in the endoscopy suite. Mental                         Status Examination: alert and oriented. Airway                         Examination: normal oropharyngeal airway and neck                         mobility. Respiratory Examination: clear to                         auscultation. CV Examination: RRR, no murmurs, no S3                         or S4. Prophylactic Antibiotics: The patient does not  require prophylactic antibiotics. Prior                         Anticoagulants: The patient has taken no anticoagulant                          or antiplatelet agents. ASA Grade Assessment: II - A                         patient with mild systemic disease. After reviewing                         the risks and benefits, the patient was deemed in                         satisfactory condition to undergo the procedure. The                         anesthesia plan was to use monitored anesthesia care                         (MAC). Immediately prior to administration of                         medications, the patient was re-assessed for adequacy                         to receive sedatives. The heart rate, respiratory                         rate, oxygen saturations, blood pressure, adequacy of                         pulmonary ventilation, and response to care were                         monitored throughout the procedure. The physical                         status of the patient was re-assessed after the                         procedure.                        After obtaining informed consent, the endoscope was                         passed under direct vision. Throughout the procedure,                         the patient's blood pressure, pulse, and oxygen                         saturations were monitored continuously. The Endoscope                         was introduced through the mouth, and advanced to the  third part of duodenum. The upper GI endoscopy was                         accomplished without difficulty. The patient tolerated                         the procedure well. Findings:      The duodenal bulb, first portion of the duodenum, second portion of the       duodenum and third portion of the duodenum were normal. Estimated blood       loss: none.      A 4 cm hiatal hernia was present. Estimated blood loss: none.      The exam of the stomach was otherwise normal.      Esophagogastric landmarks were identified: the gastroesophageal junction       was found at 33 cm from the incisors.       The Z-line was regular. Estimated blood loss: none.      One benign-appearing, intrinsic moderate (circumferential scarring or       stenosis; an endoscope may pass) stenosis was found 33 cm from the       incisors. This stenosis measured 1.2 cm (inner diameter) x less than one       cm (in length). The stenosis was traversed. A TTS dilator was passed       through the scope. Dilation with a 05-25-11 mm balloon, a 12-13.5-15 mm       balloon and a 15-16.5-18 mm balloon dilator was performed to 11 mm, 12       mm, 13.5 mm, 15 mm, 16.5 mm and 18 mm. The dilation site was examined       following endoscope reinsertion and showed mild improvement in luminal       narrowing. No change until 18mm TTS balloon was used. Estimated blood       loss: none.      Normal mucosa was found in the entire esophagus. Esophagitis has healed       Estimated blood loss: none.      The exam of the esophagus was otherwise normal. Impression:            - Normal duodenal bulb, first portion of the duodenum,                         second portion of the duodenum and third portion of                         the duodenum.                        - 4 cm hiatal hernia.                        - Esophagogastric landmarks identified.                        - Z-line regular.                        - Benign-appearing esophageal stenosis. Dilated.                        - Normal mucosa was found in the  entire esophagus.                        - No specimens collected. Recommendation:        - Patient has a contact number available for                         emergencies. The signs and symptoms of potential                         delayed complications were discussed with the patient.                         Return to normal activities tomorrow. Written                         discharge instructions were provided to the patient.                        - Discharge patient to home.                        - Soft diet today.                         - Continue present medications.                        - Continue twice a day proton pump inhibitor for 4                         more weeks, then decrease to once daily.                        - No ibuprofen, naproxen, or other non-steroidal                         anti-inflammatory drugs.                        - Repeat upper endoscopy PRN for retreatment.                        - Return to GI office PRN.                        - The findings and recommendations were discussed with                         the patient. Procedure Code(s):     --- Professional ---                        [phone], Esophagogastroduodenoscopy, flexible,                         transoral; with transendoscopic balloon dilation of                         esophagus (less than 30 mm diameter) Diagnosis Code(s):     --- Professional ---  K44.9, Diaphragmatic hernia without obstruction or                         gangrene                        K22.2, Esophageal obstruction                        R13.10, Dysphagia, unspecified                        K20.90, Esophagitis, unspecified without bleeding CPT copyright 2022 [employer]. All rights reserved. The codes documented in this report are preliminary and upon coder review may  be revised to meet current compliance requirements. Attending Participation:      I personally performed the entire procedure. Elfredia Nevins, DO [name] DO, DO 11/15/2023 12:34:38 PM This report has been signed electronically. Number of Addenda: 0 Note Initiated On: 11/15/2023 11:19 AM Estimated Blood Loss:  Estimated blood loss: none.      [facility]

## 2023-11-15 NOTE — Transfer of Care (Signed)
 Immediate Anesthesia Transfer of Care Note  Patient: Robin Marsh  Procedure(s) Performed: EGD (ESOPHAGOGASTRODUODENOSCOPY) ESOPHAGOSCOPY, WITH ESOPHAGEAL DILATION  Patient Location: Endoscopy Unit  Anesthesia Type:General  Level of Consciousness: drowsy and patient cooperative  Airway & Oxygen Therapy: Patient Spontanous Breathing and Patient connected to face mask oxygen  Post-op Assessment: Report given to RN and Post -op Vital signs reviewed and stable  Post vital signs: Reviewed and stable  Last Vitals:  Vitals Value Taken Time  BP    Temp    Pulse    Resp    SpO2      Last Pain:  Vitals:   11/15/23 1131  TempSrc: Temporal  PainSc: 0-No pain         Complications: No notable events documented.

## 2023-11-15 NOTE — Interval H&P Note (Signed)
 History and Physical Interval Note: Preprocedure H&P from 11/15/23  was reviewed and there was no interval change after seeing and examining the patient.  Written consent was obtained from the patient after discussion of risks, benefits, and alternatives. Patient has consented to proceed with Esophagogastroduodenoscopy with possible intervention   11/15/2023 12:12 PM  Robin Marsh  has presented today for surgery, with the diagnosis of Gastroesophageal reflux disease with esophagitis without hemorrhage (K21.00) Dysphagia, unspecified type (R13.10).  The various methods of treatment have been discussed with the patient and family. After consideration of risks, benefits and other options for treatment, the patient has consented to  Procedure(s): EGD (ESOPHAGOGASTRODUODENOSCOPY) (N/A) as a surgical intervention.  The patient's history has been reviewed, patient examined, no change in status, stable for surgery.  I have reviewed the patient's chart and labs.  Questions were answered to the patient's satisfaction.     [name]

## 2023-11-16 ENCOUNTER — Encounter: Payer: Self-pay | Admitting: Gastroenterology

## 2024-02-13 ENCOUNTER — Other Ambulatory Visit: Payer: Self-pay | Admitting: Family Medicine

## 2024-02-13 DIAGNOSIS — Z1231 Encounter for screening mammogram for malignant neoplasm of breast: Secondary | ICD-10-CM

## 2024-02-25 ENCOUNTER — Ambulatory Visit
Admission: RE | Admit: 2024-02-25 | Discharge: 2024-02-25 | Disposition: A | Discharge status: Home / Self Care | Source: Ambulatory Visit | Attending: Family Medicine | Admitting: Family Medicine

## 2024-02-25 DIAGNOSIS — Z1231 Encounter for screening mammogram for malignant neoplasm of breast: Secondary | ICD-10-CM | POA: Diagnosis present
